# Patient Record
Sex: Female | Born: 1945
Health system: Southern US, Community
[De-identification: ages and names within clinical notes are randomized; demographics above are authoritative.]

## PROBLEM LIST (undated history)

## (undated) DIAGNOSIS — E079 Disorder of thyroid, unspecified: Secondary | ICD-10-CM

## (undated) DIAGNOSIS — T7840XA Allergy, unspecified, initial encounter: Secondary | ICD-10-CM

## (undated) HISTORY — PX: ABDOMINAL HYSTERECTOMY: SHX81

## (undated) HISTORY — DX: Allergy, unspecified, initial encounter: T78.40XA

## (undated) HISTORY — DX: Disorder of thyroid, unspecified: E07.9

---

## 2010-07-30 ENCOUNTER — Encounter: Payer: Self-pay | Admitting: Emergency Medicine

## 2011-09-26 ENCOUNTER — Telehealth: Payer: Self-pay

## 2011-09-26 ENCOUNTER — Other Ambulatory Visit: Payer: Self-pay | Admitting: Family Medicine

## 2011-09-26 MED ORDER — ROSUVASTATIN CALCIUM 10 MG PO TABS: 10.0000 mg | ORAL_TABLET | Freq: Every day | ORAL | Status: DC

## 2011-09-26 MED ORDER — LEVOTHYROXINE SODIUM 75 MCG PO TABS: 75.0000 ug | ORAL_TABLET | Freq: Every day | ORAL | Status: DC

## 2011-09-26 NOTE — Telephone Encounter (Signed)
.  [facility] PT STATES SHE IS IN NEED OF HER CRESTOR AND THYROID MEDICINE. PLEASE CALL [phone]   [facility] ON [address]

## 2011-09-27 NOTE — Telephone Encounter (Signed)
Meds e-rx'd yesterday, pt notified and advised to RTC for more.

## 2011-11-09 ENCOUNTER — Ambulatory Visit (INDEPENDENT_AMBULATORY_CARE_PROVIDER_SITE_OTHER): Payer: Medicare Other | Admitting: Internal Medicine

## 2011-11-09 VITALS — BP 151/87 | HR 62 | Temp 98.2°F | Resp 18 | Ht 64.5 in | Wt 193.0 lb

## 2011-11-09 DIAGNOSIS — E789 Disorder of lipoprotein metabolism, unspecified: Secondary | ICD-10-CM | POA: Insufficient documentation

## 2011-11-09 DIAGNOSIS — E039 Hypothyroidism, unspecified: Secondary | ICD-10-CM | POA: Insufficient documentation

## 2011-11-09 DIAGNOSIS — Z7189 Other specified counseling: Secondary | ICD-10-CM

## 2011-11-09 LAB — COMPREHENSIVE METABOLIC PANEL
ALT: 15 U/L (ref 0–35)
Alkaline Phosphatase: 45 U/L (ref 39–117)
Creat: 0.66 mg/dL (ref 0.50–1.10)
Glucose, Bld: 91 mg/dL (ref 70–99)
Sodium: 141 mEq/L (ref 135–145)
Total Bilirubin: 0.5 mg/dL (ref 0.3–1.2)
Total Protein: 7.1 g/dL (ref 6.0–8.3)

## 2011-11-09 LAB — POCT CBC
HCT, POC: 43.7 % (ref 37.7–47.9)
Hemoglobin: 14.2 g/dL (ref 12.2–16.2)
Lymph, poc: 2.2 (ref 0.6–3.4)
MCH, POC: 31.3 pg — AB (ref 27–31.2)
MCV: 96.5 fL (ref 80–97)
MPV: 11.2 fL (ref 0–99.8)
POC Granulocyte: 4.7 (ref 2–6.9)
Platelet Count, POC: 245 10*3/uL (ref 142–424)

## 2011-11-09 LAB — POCT URINALYSIS DIPSTICK
Bilirubin, UA: NEGATIVE
Blood, UA: NEGATIVE
Protein, UA: NEGATIVE
Urobilinogen, UA: 0.2

## 2011-11-09 LAB — LIPID PANEL
Cholesterol: 283 mg/dL — ABNORMAL HIGH (ref 0–200)
LDL Cholesterol: 197 mg/dL — ABNORMAL HIGH (ref 0–99)
VLDL: 21 mg/dL (ref 0–40)

## 2011-11-09 LAB — TSH: TSH: 3.305 u[IU]/mL (ref 0.350–4.500)

## 2011-11-09 MED ORDER — LEVOTHYROXINE SODIUM 75 MCG PO TABS: 75.0000 ug | ORAL_TABLET | Freq: Every day | ORAL | Status: DC

## 2011-11-09 MED ORDER — ROSUVASTATIN CALCIUM 10 MG PO TABS: 10.0000 mg | ORAL_TABLET | Freq: Every day | ORAL | Status: DC

## 2011-11-09 NOTE — Progress Notes (Signed)
  Subjective:    Patient ID: Stephanie Carter, female    DOB: 08/27/1945, 66 y.o.   MRN: [id number]  HPI Hypothyroid out of meds feels ok Hyperlipidemia out of crestor and feels fine No other complaints   Review of Systems     Objective:   Physical Exam Normal VS Lungs clear Heart RR       Assessment & Plan:  Schedule cpe soon RF meds 6 months

## 2011-11-09 NOTE — Patient Instructions (Signed)
Hypercholesterolemia High Blood Cholesterol Cholesterol is a white, waxy, fat-like protein needed by your body in small amounts. The liver makes all the cholesterol you need. It is carried from the liver by the blood through the blood vessels. Deposits (plaque) may build up on blood vessel walls. This makes the arteries narrower and stiffer. Plaque increases the risk for heart attack and stroke. You cannot feel your cholesterol level even if it is very high. The only way to know is by a blood test to check your lipid (fats) levels. Once you know your cholesterol levels, you should keep a record of the test results. Work with your caregiver to to keep your levels in the desired range. WHAT THE RESULTS MEAN:  Total cholesterol is a rough measure of all the cholesterol in your blood.   LDL is the so-called bad cholesterol. This is the type that deposits cholesterol in the walls of the arteries. You want this level to be low.   HDL is the good cholesterol because it cleans the arteries and carries the LDL away. You want this level to be high.   Triglycerides are fat that the body can either burn for energy or store. High levels are closely linked to heart disease.  DESIRED LEVELS:  Total cholesterol below 200.   LDL below 100 for people at risk, below 70 for very high risk.   HDL above 50 is good, above 60 is best.   Triglycerides below 150.  HOW TO LOWER YOUR CHOLESTEROL:  Diet.   Choose fish or white meat chicken and [place], roasted or baked. Limit fatty cuts of red meat, fried foods, and processed meats, such as sausage and lunch meat.   Eat lots of fresh fruits and vegetables. Choose whole grains, beans, pasta, potatoes and cereals.   Use only small amounts of olive, corn or canola oils. Avoid butter, mayonnaise, shortening or palm kernel oils. Avoid foods with trans-fats.   Use skim/nonfat milk and low-fat/nonfat yogurt and cheeses. Avoid whole milk, cream, ice cream, egg yolks and  cheeses. Healthy desserts include angel food cake, gingersnaps, animal crackers, hard candy, popsicles, and low-fat/nonfat frozen yogurt. Avoid pastries, cakes, pies and cookies.   Exercise.   A regular program helps decrease LDL and raises HDL.   Helps with weight control.   Do things that increase your activity level like gardening, walking, or taking the stairs.   Medication.   May be prescribed by your caregiver to help lowering cholesterol and the risk for heart disease.   You may need medicine even if your levels are normal if you have several risk factors.  HOME CARE INSTRUCTIONS   Follow your diet and exercise programs as suggested by your caregiver.   Take medications as directed.   Have blood work done when your caregiver feels it is necessary.  MAKE SURE YOU:   Understand these instructions.   Will watch your condition.   Will get help right away if you are not doing well or get worse.  Document Released: 06/25/2005 Document Revised: 06/14/2011 Document Reviewed: 12/11/2006 [facility] Patient Information 2012 [place], [place].Hypothyroidism The thyroid is a large gland located in the lower front of your neck. The thyroid gland helps control metabolism. Metabolism is how your body handles food. It controls metabolism with the hormone thyroxine. When this gland is underactive (hypothyroid), it produces too little hormone.  CAUSES These include:   Absence or destruction of thyroid tissue.   Goiter due to iodine deficiency.   Goiter due  to medications.   Congenital defects (since birth).   Problems with the pituitary. This causes a lack of TSH (thyroid stimulating hormone). This hormone tells the thyroid to turn out more hormone.  SYMPTOMS  Lethargy (feeling as though you have no energy)   Cold intolerance   Weight gain (in spite of normal food intake)   Dry skin   Coarse hair   Menstrual irregularity (if severe, may lead to infertility)   Slowing of  thought processes  Cardiac problems are also caused by insufficient amounts of thyroid hormone. Hypothyroidism in the newborn is cretinism, and is an extreme form. It is important that this form be treated adequately and immediately or it will lead rapidly to retarded physical and mental development. DIAGNOSIS  To prove hypothyroidism, your caregiver may do blood tests and ultrasound tests. Sometimes the signs are hidden. It may be necessary for your caregiver to watch this illness with blood tests either before or after diagnosis and treatment. TREATMENT  Low levels of thyroid hormone are increased by using synthetic thyroid hormone. This is a safe, effective treatment. It usually takes about four weeks to gain the full effects of the medication. After you have the full effect of the medication, it will generally take another four weeks for problems to leave. Your caregiver may start you on low doses. If you have had heart problems the dose may be gradually increased. It is generally not an emergency to get rapidly to normal. HOME CARE INSTRUCTIONS   Take your medications as your caregiver suggests. Let your caregiver know of any medications you are taking or start taking. Your caregiver will help you with dosage schedules.   As your condition improves, your dosage needs may increase. It will be necessary to have continuing blood tests as suggested by your caregiver.   Report all suspected medication side effects to your caregiver.  SEEK MEDICAL CARE IF: Seek medical care if you develop:  Sweating.   Tremulousness (tremors).   Anxiety.   Rapid weight loss.   Heat intolerance.   Emotional swings.   Diarrhea.   Weakness.  SEEK IMMEDIATE MEDICAL CARE IF:  You develop chest pain, an irregular heart beat (palpitations), or a rapid heart beat. MAKE SURE YOU:   Understand these instructions.   Will watch your condition.   Will get help right away if you are not doing well or get  worse.  Document Released: 06/25/2005 Document Revised: 06/14/2011 Document Reviewed: 02/13/2008 [facility] Patient Information 2012 [place], [place].

## 2012-06-16 ENCOUNTER — Encounter: Payer: Self-pay | Admitting: *Deleted

## 2012-06-20 ENCOUNTER — Other Ambulatory Visit: Payer: Self-pay | Admitting: Internal Medicine

## 2012-07-10 ENCOUNTER — Telehealth: Payer: Self-pay

## 2012-07-10 NOTE — Telephone Encounter (Signed)
Pt has 10 days of cholesterol and thyroid rx left. Has appt to see dr Milus Glazier on 08-08-11. Asks for refill until then.  [facility].  bf

## 2012-07-11 MED ORDER — LEVOTHYROXINE SODIUM 75 MCG PO TABS: 75.0000 ug | ORAL_TABLET | Freq: Every day | ORAL | Status: DC

## 2012-07-11 MED ORDER — ROSUVASTATIN CALCIUM 10 MG PO TABS: 10.0000 mg | ORAL_TABLET | Freq: Every day | ORAL | Status: DC

## 2012-07-11 NOTE — Telephone Encounter (Signed)
Crestor and Synthroid refilled for 30 days

## 2012-07-11 NOTE — Telephone Encounter (Signed)
Notified pt that Rxs have been sent to pharmacy.

## 2012-08-07 ENCOUNTER — Encounter: Payer: Self-pay | Admitting: Family Medicine

## 2012-08-07 ENCOUNTER — Ambulatory Visit (INDEPENDENT_AMBULATORY_CARE_PROVIDER_SITE_OTHER): Payer: Medicare Other | Admitting: Family Medicine

## 2012-08-07 VITALS — BP 176/89 | HR 66 | Temp 97.4°F | Resp 18 | Ht 64.5 in | Wt 204.0 lb

## 2012-08-07 DIAGNOSIS — E785 Hyperlipidemia, unspecified: Secondary | ICD-10-CM

## 2012-08-07 DIAGNOSIS — J310 Chronic rhinitis: Secondary | ICD-10-CM

## 2012-08-07 DIAGNOSIS — Z0000 Encounter for general adult medical examination without abnormal findings: Secondary | ICD-10-CM

## 2012-08-07 DIAGNOSIS — E039 Hypothyroidism, unspecified: Secondary | ICD-10-CM

## 2012-08-07 LAB — COMPREHENSIVE METABOLIC PANEL
ALT: 16 U/L (ref 0–35)
AST: 18 U/L (ref 0–37)
Albumin: 4.5 g/dL (ref 3.5–5.2)
Alkaline Phosphatase: 46 U/L (ref 39–117)
BUN: 19 mg/dL (ref 6–23)
CO2: 29 mEq/L (ref 19–32)
Calcium: 9.5 mg/dL (ref 8.4–10.5)
Chloride: 107 mEq/L (ref 96–112)
Creat: 0.67 mg/dL (ref 0.50–1.10)
Glucose, Bld: 104 mg/dL — ABNORMAL HIGH (ref 70–99)
Potassium: 4.3 mEq/L (ref 3.5–5.3)
Sodium: 141 mEq/L (ref 135–145)
Total Bilirubin: 0.6 mg/dL (ref 0.3–1.2)
Total Protein: 6.8 g/dL (ref 6.0–8.3)

## 2012-08-07 LAB — POCT URINALYSIS DIPSTICK
Bilirubin, UA: NEGATIVE
Glucose, UA: NEGATIVE
Ketones, UA: NEGATIVE
Nitrite, UA: NEGATIVE
Protein, UA: NEGATIVE
Spec Grav, UA: 1.025
Urobilinogen, UA: 0.2
pH, UA: 5.5

## 2012-08-07 LAB — LIPID PANEL
Cholesterol: 187 mg/dL (ref 0–200)
HDL: 72 mg/dL (ref >39)
LDL Cholesterol: 100 mg/dL — ABNORMAL HIGH (ref 0–99)
Total CHOL/HDL Ratio: 2.6 Ratio
Triglycerides: 76 mg/dL (ref <150)
VLDL: 15 mg/dL (ref 0–40)

## 2012-08-07 LAB — TSH: TSH: 1.067 u[IU]/mL (ref 0.350–4.500)

## 2012-08-07 MED ORDER — IPRATROPIUM BROMIDE 0.06 % NA SOLN: 2.0000 | Freq: Four times a day (QID) | NASAL | Status: DC

## 2012-08-07 MED ORDER — ROSUVASTATIN CALCIUM 10 MG PO TABS: 10.0000 mg | ORAL_TABLET | Freq: Every day | ORAL | Status: DC

## 2012-08-07 MED ORDER — LEVOTHYROXINE SODIUM 75 MCG PO TABS: 75.0000 ug | ORAL_TABLET | Freq: Every day | ORAL | Status: DC

## 2012-08-07 NOTE — Progress Notes (Signed)
Patient ID: Stephanie Carter MRN: [id number], DOB: 1945/08/06, 67 y.o. Date of Encounter: 08/07/2012, 4:58 PM  Primary Physician: Elvina Sidle, MD  Chief Complaint: Physical (CPE)  HPI: 67 y.o. y/o female with history of noted below here for CPE.  Doing well.  Originally from [place] No issues/complaints.  Works at [employer] and [employer].  Quit cigs for 6 weeks but then picked them back up again; chronically wheezes.  Wants to quit.  Chantix made her sick.  Will try gum   MMG: 2011; patient will schedule Review of Systems: Consitutional: No fever, chills, fatigue, night sweats, lymphadenopathy, or weight changes. Eyes: No visual changes, eye redness, or discharge. ENT/Mouth: Ears: No otalgia, tinnitus, hearing loss, discharge. Nose: No congestion, rhinorrhea, sinus pain, or epistaxis. Throat: No sore throat, post nasal drip, or teeth pain. Cardiovascular: No CP, palpitations, diaphoresis, DOE, edema, orthopnea, PND. Respiratory: No cough, hemoptysis, SOB, or wheezing. Gastrointestinal: No anorexia, dysphagia, reflux, pain, nausea, vomiting, hematemesis, diarrhea, constipation, BRBPR, or melena. Breast: No discharge, pain, swelling, or mass. Genitourinary: No dysuria, frequency, urgency, hematuria, incontinence, nocturia, amenorrhea, vaginal discharge, pruritis, burning, abnormal bleeding, or pain. Musculoskeletal: No decreased ROM, myalgias, stiffness, joint swelling, or weakness. Skin: No rash, erythema, lesion changes, pain, warmth, jaundice, or pruritis. Neurological: No headache, dizziness, syncope, seizures, tremors, memory loss, coordination problems, or paresthesias. Psychological: No anxiety, depression, hallucinations, SI/HI. Endocrine: No fatigue, polydipsia, polyphagia, polyuria, or known diabetes. All other systems were reviewed and are otherwise negative.  Past Medical History  Diagnosis Date  . Allergy      Past Surgical History  Procedure Date  . Abdominal  hysterectomy     Home Meds:  Prior to Admission medications   Medication Sig Start Date End Date Taking? Authorizing Provider  levothyroxine (SYNTHROID, LEVOTHROID) 75 MCG tablet Take 1 tablet (75 mcg total) by mouth daily. 07/11/12  Yes Eleanore Delia Chimes, PA-C  rosuvastatin (CRESTOR) 10 MG tablet Take 1 tablet (10 mg total) by mouth daily. 07/11/12  Yes Godfrey Pick, PA-C    Allergies: No Known Allergies  History   Social History  . Marital Status: Single    Spouse Name: N/A    Number of Children: N/A  . Years of Education: N/A   Occupational History  . Not on file.   Social History Main Topics  . Smoking status: Current Every Day Smoker  . Smokeless tobacco: Not on file  . Alcohol Use: Yes  . Drug Use: No  . Sexually Active: No   Other Topics Concern  . Not on file   Social History Narrative  . No narrative on file    Family History  Problem Relation Age of Onset  . Stroke Father     Physical Exam:  128/74 Blood pressure 176/89, pulse 66, temperature 97.4 F (36.3 C), resp. rate 18, height 5' 4.5" (1.638 m), weight 204 lb (92.534 kg)., Body mass index is 34.48 kg/(m^2). General: Well developed, well nourished, in no acute distress. HEENT: Normocephalic, atraumatic. Conjunctiva pink, sclera non-icteric. Pupils 2 mm constricting to 1 mm, round, regular, and equally reactive to light and accomodation. EOMI. Internal auditory canal clear. TMs with good cone of light and without pathology. Nasal mucosa pink. Nares are without discharge. No sinus tenderness. Oral mucosa pink. Dentition poor. Pharynx without exudate.   Neck: Supple. Trachea midline. No thyromegaly. Full ROM. No lymphadenopathy. Lungs: Clear to auscultation bilaterally without wheezes, rales, or rhonchi. Breathing is of normal effort and unlabored. Cardiovascular: RRR with S1 S2. No murmurs,  rubs, or gallops appreciated. Distal pulses 2+ symmetrically. No carotid or abdominal bruits. Abdomen: Soft,  non-tender, non-distended with normoactive bowel sounds. No hepatosplenomegaly or masses. No rebound/guarding. No CVA tenderness. Without hernias.  Musculoskeletal: Full range of motion and 5/5 strength throughout. Without swelling, atrophy, tenderness, crepitus, or warmth. Extremities without clubbing, cyanosis, or edema. Calves supple. Skin: Warm and moist without erythema, ecchymosis, wounds, or rash. Neuro: A+Ox3. CN II-XII grossly intact. Moves all extremities spontaneously. Full sensation throughout. Normal gait. DTR 2+ throughout upper and lower extremities. Finger to nose intact. Psych:  Responds to questions appropriately with a normal affect.   Studies: CBC, CMET, Lipid UA:  Results for orders placed in visit on 08/07/12  POCT URINALYSIS DIPSTICK      Component Value Range   Color, UA yellow     Clarity, UA clear     Glucose, UA neg     Bilirubin, UA neg     Ketones, UA neg     Spec Grav, UA 1.025     Blood, UA trace-lysed     pH, UA 5.5     Protein, UA neg     Urobilinogen, UA 0.2     Nitrite, UA neg     Leukocytes, UA small (1+)       Assessment/Plan:  67 y.o. y/o female here for CPE 1. PE (physical exam), annual  POCT urinalysis dipstick, Comprehensive metabolic panel, Lipid panel, TSH, Ambulatory referral to Gastroenterology     -  Signed, Elvina Sidle, MD 08/07/2012 4:58 PM

## 2013-09-10 ENCOUNTER — Telehealth: Payer: Self-pay

## 2013-09-10 DIAGNOSIS — E785 Hyperlipidemia, unspecified: Secondary | ICD-10-CM

## 2013-09-10 DIAGNOSIS — E039 Hypothyroidism, unspecified: Secondary | ICD-10-CM

## 2013-09-10 MED ORDER — ROSUVASTATIN CALCIUM 10 MG PO TABS: 10.0000 mg | ORAL_TABLET | Freq: Every day | ORAL | Status: DC

## 2013-09-10 MED ORDER — LEVOTHYROXINE SODIUM 75 MCG PO TABS: 75.0000 ug | ORAL_TABLET | Freq: Every day | ORAL | Status: DC

## 2013-09-10 NOTE — Telephone Encounter (Signed)
Pt scheduled appt with Dr L for her CPE. Next available is 11/05/13. Pt requests refill on crestor and levathoroxin until then. She will be out on Saturday.  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  Best: [phone]    [facility]

## 2013-09-10 NOTE — Telephone Encounter (Signed)
Dr L, can we RF until her CPE appt?

## 2013-09-11 ENCOUNTER — Other Ambulatory Visit: Payer: Self-pay | Admitting: Family Medicine

## 2013-09-11 DIAGNOSIS — E785 Hyperlipidemia, unspecified: Secondary | ICD-10-CM

## 2013-09-11 MED ORDER — ATORVASTATIN CALCIUM 20 MG PO TABS: 20.0000 mg | ORAL_TABLET | Freq: Every day | ORAL | Status: DC

## 2013-09-11 NOTE — Telephone Encounter (Signed)
Patient would like if okay wiht provider to change her crestor [facility] a [facility]   [phone]

## 2013-11-05 ENCOUNTER — Ambulatory Visit (INDEPENDENT_AMBULATORY_CARE_PROVIDER_SITE_OTHER): Payer: Medicare Other | Admitting: Family Medicine

## 2013-11-05 ENCOUNTER — Encounter: Payer: Self-pay | Admitting: Family Medicine

## 2013-11-05 VITALS — BP 132/76 | HR 56 | Temp 97.6°F | Resp 16 | Ht 64.5 in | Wt 198.0 lb

## 2013-11-05 DIAGNOSIS — Z1239 Encounter for other screening for malignant neoplasm of breast: Secondary | ICD-10-CM

## 2013-11-05 DIAGNOSIS — Z0000 Encounter for general adult medical examination without abnormal findings: Secondary | ICD-10-CM

## 2013-11-05 DIAGNOSIS — L408 Other psoriasis: Secondary | ICD-10-CM

## 2013-11-05 DIAGNOSIS — F17200 Nicotine dependence, unspecified, uncomplicated: Secondary | ICD-10-CM | POA: Insufficient documentation

## 2013-11-05 DIAGNOSIS — Z87891 Personal history of nicotine dependence: Secondary | ICD-10-CM | POA: Insufficient documentation

## 2013-11-05 DIAGNOSIS — E785 Hyperlipidemia, unspecified: Secondary | ICD-10-CM

## 2013-11-05 DIAGNOSIS — L409 Psoriasis, unspecified: Secondary | ICD-10-CM | POA: Insufficient documentation

## 2013-11-05 DIAGNOSIS — E039 Hypothyroidism, unspecified: Secondary | ICD-10-CM

## 2013-11-05 LAB — COMPLETE METABOLIC PANEL WITH GFR
ALT: 17 U/L (ref 0–35)
AST: 17 U/L (ref 0–37)
Albumin: 4.4 g/dL (ref 3.5–5.2)
Alkaline Phosphatase: 42 U/L (ref 39–117)
BUN: 16 mg/dL (ref 6–23)
CO2: 29 mEq/L (ref 19–32)
Calcium: 9.5 mg/dL (ref 8.4–10.5)
Chloride: 104 mEq/L (ref 96–112)
Creat: 0.71 mg/dL (ref 0.50–1.10)
GFR, Est African American: >89 mL/min
GFR, Est Non African American: 87 mL/min
Glucose, Bld: 101 mg/dL — ABNORMAL HIGH (ref 70–99)
Potassium: 4.1 mEq/L (ref 3.5–5.3)
Sodium: 142 mEq/L (ref 135–145)
Total Bilirubin: 0.8 mg/dL (ref 0.2–1.2)
Total Protein: 6.8 g/dL (ref 6.0–8.3)

## 2013-11-05 LAB — LIPID PANEL
Cholesterol: 161 mg/dL (ref 0–200)
HDL: 71 mg/dL (ref >39)
LDL Cholesterol: 66 mg/dL (ref 0–99)
Total CHOL/HDL Ratio: 2.3 Ratio
Triglycerides: 118 mg/dL (ref <150)
VLDL: 24 mg/dL (ref 0–40)

## 2013-11-05 LAB — POCT URINALYSIS DIPSTICK
Bilirubin, UA: NEGATIVE
Glucose, UA: NEGATIVE
Ketones, UA: NEGATIVE
Nitrite, UA: NEGATIVE
Protein, UA: NEGATIVE
Spec Grav, UA: 1.025
Urobilinogen, UA: 0.2
pH, UA: 5.5

## 2013-11-05 LAB — CBC
HCT: 40.6 % (ref 36.0–46.0)
Hemoglobin: 13.9 g/dL (ref 12.0–15.0)
MCH: 31.9 pg (ref 26.0–34.0)
MCHC: 34.2 g/dL (ref 30.0–36.0)
MCV: 93.1 fL (ref 78.0–100.0)
Platelets: 215 10*3/uL (ref 150–400)
RBC: 4.36 MIL/uL (ref 3.87–5.11)
RDW: 13.5 % (ref 11.5–15.5)
WBC: 5.1 10*3/uL (ref 4.0–10.5)

## 2013-11-05 LAB — TSH: TSH: 1.857 u[IU]/mL (ref 0.350–4.500)

## 2013-11-05 MED ORDER — DESONIDE 0.05 % EX OINT: 1.0000 "application " | TOPICAL_OINTMENT | Freq: Two times a day (BID) | CUTANEOUS | Status: DC

## 2013-11-05 MED ORDER — LEVOTHYROXINE SODIUM 75 MCG PO TABS: 75.0000 ug | ORAL_TABLET | Freq: Every day | ORAL | Status: DC

## 2013-11-05 NOTE — Patient Instructions (Signed)

## 2013-11-05 NOTE — Progress Notes (Addendum)
Subjective:    Patient ID: Stephanie Carter, female    DOB: 02/09/1946, 68 y.o.   MRN: [id number] This chart was scribed for Robyn Haber, MD by Anastasia Pall, ED Scribe. This patient was seen in room 27 and the patient's care was started at 9:07 AM.  Chief Complaint  Patient presents with   Annual Exam   Medication Refill    desonide, lipitor   HPI Stephanie Carter is a 68 y.o. female with history below who presents for an annual exam and medication refill.   She reports bilateral LE pain and stiffness, worse in her right knee and shin than her left. She states standing for long periods of time exacerbates her LE pain, especially after working all day. She used to play a lot of racquetball, but has quit due to the starting and stopping nature of the sport.   She reports quitting smoking for 6 weeks while in [place], but resumed smoking [place].   She reports intermittent nasal congestion with environmental allergies around this time of year. She usually does not have issues while in [place].   She reports having colonoscopy last year by Dr. Benson Norway. She denies abdominal pain, but reports occasional gas. She has h/o hysterectomy.   She states she is ready to quit working, wants to travel [place]. She states she traveled to [place] not too long ago and states it was marvelous!  Patient Active Problem List   Diagnosis Date Noted   Lipid disorder 11/09/2011   Hypothyroid 11/09/2011   Past Medical History  Diagnosis Date   Allergy    Thyroid disease    Past Surgical History  Procedure Laterality Date   Abdominal hysterectomy     No Known Allergies Prior to Admission medications   Medication Sig Start Date End Date Taking? Authorizing Provider  atorvastatin (LIPITOR) 20 MG tablet Take 1 tablet (20 mg total) by mouth daily. 09/11/13  Yes Robyn Haber, MD  desonide (DESOWEN) 0.05 % ointment Apply 1 application topically 2 (two) times daily.   Yes  Historical Provider, MD  ipratropium (ATROVENT) 0.06 % nasal spray Place 2 sprays into the nose 4 (four) times daily. 08/07/12  Yes Robyn Haber, MD  levothyroxine (SYNTHROID, LEVOTHROID) 75 MCG tablet Take 1 tablet (75 mcg total) by mouth daily. 09/10/13  Yes Robyn Haber, MD  Multiple Vitamin (MULTIVITAMIN) tablet Take 1 tablet by mouth daily.   Yes Historical Provider, MD    Review of Systems  Constitutional: Negative for fever, chills, diaphoresis, appetite change and fatigue.  HENT: Positive for congestion (secondary to enivornmental allergies). Negative for ear pain, hearing loss, mouth sores, sore throat and trouble swallowing.   Eyes: Negative for visual disturbance.  Respiratory: Negative for cough, chest tightness, shortness of breath and wheezing.   Cardiovascular: Negative for chest pain.  Gastrointestinal: Positive for abdominal distention (occasional gas). Negative for nausea, vomiting, abdominal pain, diarrhea and constipation.  Endocrine: Negative for polydipsia, polyphagia and polyuria.  Genitourinary: Negative for dysuria, frequency and hematuria.  Musculoskeletal: Positive for arthralgias (bilateral knees, worse right than left) and myalgias (bilateral LE, worse in right shin). Negative for gait problem.  Skin: Negative for color change, rash and wound.  Allergic/Immunologic: Positive for environmental allergies.  Neurological: Negative for dizziness, syncope, light-headedness and headaches.  Hematological: Does not bruise/bleed easily.  Psychiatric/Behavioral: Negative for behavioral problems and confusion.      Objective:   Physical Exam  BP 132/76   Pulse 56  Temp(Src) 97.6 F (36.4 C)   Resp 16   Ht 5' 4.5" (1.638 m)   Wt 198 lb (89.812 kg)   BMI 33.47 kg/m2   SpO2 98% Nursing note and vitals reviewed. Constitutional: She is oriented to person, place, and time. She appears well-developed and well-nourished. No distress.  HENT:  Head: Normocephalic and  atraumatic.  Right Ear: Hearing, external ear and ear canal normal.  Left Ear: Hearing, external ear and ear canal normal.  Nose: Nose normal.  Mouth/Throat: Uvula is midline, oropharynx is clear and moist and mucous membranes are normal. No oropharyngeal exudate or posterior oropharyngeal erythema.  Scaring in bilateral TM, worse left TM.  Eyes: Conjunctivae and EOM are normal. Pupils are equal, round, and reactive to light.  Neck: Normal range of motion. Neck supple. No thyromegaly present.  Cardiovascular: Normal rate, regular rhythm, normal heart sounds and intact distal pulses.   No murmur heard. Pulmonary/Chest: Effort normal and breath sounds normal. No respiratory distress. She has no wheezes. She has no rales. She exhibits no tenderness. Breast exam normal. Abdominal: Soft. Bowel sounds are normal. She exhibits no distension and no mass. There is no hepatosplenomegaly. There is no tenderness. There is no rebound and no guarding.  Musculoskeletal: Normal range of motion. She exhibits no edema and no tenderness.  Lymphadenopathy:    She has no cervical adenopathy.  Neurological: She is alert and oriented to person, place, and time. She has normal strength and normal reflexes. No cranial nerve deficit or sensory deficit. She exhibits normal muscle tone. She displays a negative Romberg sign. Coordination normal.  Skin: Skin is warm and dry. She has mild erythema behind left ear and scattered over scalp Psychiatric: She has a normal mood and affect. Her behavior is normal.      Assessment & Plan:   Screening for breast cancer - Plan: MM Digital Screening  Hypothyroidism - Plan: levothyroxine (SYNTHROID, LEVOTHROID) 75 MCG tablet, TSH  Routine general medical examination at a health care facility - Plan: POCT urinalysis dipstick, TSH, CBC, COMPLETE METABOLIC PANEL WITH GFR  Other and unspecified hyperlipidemia - Plan: Lipid panel  Signed, Robyn Haber, MD

## 2013-11-05 NOTE — Progress Notes (Signed)
   Subjective:    Patient ID: Stephanie Carter, female    DOB: 12/16/1945, 68 y.o.   MRN: [id number]  HPI    Review of Systems  HENT: Positive for postnasal drip and sinus pressure.   Musculoskeletal: Positive for arthralgias and myalgias.  Skin: Positive for rash.  Allergic/Immunologic: Positive for environmental allergies.       Objective:   Physical Exam        Assessment & Plan:

## 2013-12-09 LAB — HM MAMMOGRAPHY

## 2013-12-25 ENCOUNTER — Encounter: Payer: Self-pay | Admitting: Family Medicine

## 2014-05-03 ENCOUNTER — Ambulatory Visit (INDEPENDENT_AMBULATORY_CARE_PROVIDER_SITE_OTHER): Payer: Medicare Other | Admitting: Family Medicine

## 2014-05-03 ENCOUNTER — Encounter: Payer: Self-pay | Admitting: Family Medicine

## 2014-05-03 VITALS — BP 152/94 | HR 78 | Temp 98.3°F | Resp 17 | Ht 64.0 in | Wt 196.0 lb

## 2014-05-03 DIAGNOSIS — R059 Cough, unspecified: Secondary | ICD-10-CM

## 2014-05-03 DIAGNOSIS — R05 Cough: Secondary | ICD-10-CM

## 2014-05-03 DIAGNOSIS — Z1159 Encounter for screening for other viral diseases: Secondary | ICD-10-CM

## 2014-05-03 DIAGNOSIS — L409 Psoriasis, unspecified: Secondary | ICD-10-CM

## 2014-05-03 DIAGNOSIS — J069 Acute upper respiratory infection, unspecified: Secondary | ICD-10-CM

## 2014-05-03 LAB — HEPATITIS C ANTIBODY: HCV AB: NEGATIVE

## 2014-05-03 MED ORDER — DOXYCYCLINE HYCLATE 100 MG PO CAPS: 100.0000 mg | ORAL_CAPSULE | Freq: Two times a day (BID) | ORAL | Status: DC

## 2014-05-03 MED ORDER — TRIAMCINOLONE ACETONIDE 0.1 % EX CREA: 1.0000 "application " | TOPICAL_CREAM | Freq: Two times a day (BID) | CUTANEOUS | Status: DC

## 2014-05-03 MED ORDER — ALBUTEROL SULFATE HFA 108 (90 BASE) MCG/ACT IN AERS: 2.0000 | INHALATION_SPRAY | Freq: Four times a day (QID) | RESPIRATORY_TRACT | Status: DC | PRN

## 2014-05-03 MED ORDER — HYDROCODONE-HOMATROPINE 5-1.5 MG/5ML PO SYRP: 5.0000 mL | ORAL_SOLUTION | Freq: Three times a day (TID) | ORAL | Status: DC | PRN

## 2014-05-03 NOTE — Patient Instructions (Signed)
You most likely have a viral infection.  Use the hycodan syrup for cough- however remember this will make you sleepy so do not use when you need to drive.  It is ok to continue OTC medications as needed.  Also you can use the albuterol inhaler as needed for wheezing and chest tightness.   Try the triamcinolone instead for your psoriasis it should be less expensive.    If you are not feeling better in the next few days fill and use the doxycycline antibiotic.  Take this with food and a good glass of water

## 2014-05-03 NOTE — Progress Notes (Signed)
[facility] [address] [place] [address] [phone]  Date:  05/03/2014   Name:  Stephanie Carter   DOB:  [birth year]   MRN:  [id number]  PCP:  Robyn Haber, MD    Chief Complaint: Cough, URI and Nasal Congestion   History of Present Illness:  Stephanie Carter is a 68 y.o. very pleasant female patient who presents with the following:  Here today with illness. Her sx started with a "drippy nose and nose stopped up."  She thought the issue was with her sinuses as she had a mild frontal HA.  However sx then turned into chest congestion and productive cough.  Sx started about 5 days ago.   She is coughing more at night.  She has felt hot but does not think she has had a fever.  She has not noted chills or aches.  No GI symptoms.  She has tried some sort of mucinex OTC for her sx   She likes to use some desonide for psoriasis. However it was quite expensive when she last tried to fill it; would be interested in an alternative medication She would like to have hep c screenig- she has seen a billboard advertising this recommendation  Patient Active Problem List   Diagnosis Date Noted  . Psoriasis 11/05/2013  . Smoking 11/05/2013  . Lipid disorder 11/09/2011  . Hypothyroid 11/09/2011    Past Medical History  Diagnosis Date  . Allergy   . Thyroid disease     Past Surgical History  Procedure Laterality Date  . Abdominal hysterectomy      History  Substance Use Topics  . Smoking status: Current Every Day Smoker -- 1.00 packs/day for 49 years    Types: Cigarettes  . Smokeless tobacco: Not on file  . Alcohol Use: Yes    Family History  Problem Relation Age of Onset  . Stroke Father     No Known Allergies  Medication list has been reviewed and updated.  Current Outpatient Prescriptions on File Prior to Visit  Medication Sig Dispense Refill  . atorvastatin (LIPITOR) 20 MG tablet Take 1 tablet (20 mg total) by mouth daily.  90 tablet  3  .  desonide (DESOWEN) 0.05 % ointment Apply 1 application topically 2 (two) times daily.  60 g  11  . ipratropium (ATROVENT) 0.06 % nasal spray Place 2 sprays into the nose 4 (four) times daily.  15 mL  12  . levothyroxine (SYNTHROID, LEVOTHROID) 75 MCG tablet Take 1 tablet (75 mcg total) by mouth daily.  90 tablet  3  . Multiple Vitamin (MULTIVITAMIN) tablet Take 1 tablet by mouth daily.       No current facility-administered medications on file prior to visit.    Review of Systems:  As per HPI- otherwise negative.   Physical Examination: Filed Vitals:   05/03/14 0830  BP: 152/94  Pulse: 78  Temp: 98.3 F (36.8 C)  Resp: 17   Filed Vitals:   05/03/14 0830  Height: 5\' 4"  (1.626 m)  Weight: 196 lb (88.905 kg)   Body mass index is 33.63 kg/(m^2). Ideal Body Weight: Weight in (lb) to have BMI = 25: 145.3  GEN: WDWN, NAD, Non-toxic, A & O x 3, looks well except for nasal congestion, overweight HEENT: Atraumatic, Normocephalic. Neck supple. No masses, No LAD. Bilateral TM wnl, oropharynx normal.  PEERL,EOMI.   Ears and Nose: No external deformity. CV: RRR, No M/G/R. No JVD. No thrill. No extra  heart sounds. PULM: CTA B, no wheezes, crackles, rhonchi. No retractions. No resp. distress. No accessory muscle use. EXTR: No c/c/e NEURO Normal gait.  PSYCH: Normally interactive. Conversant. Not depressed or anxious appearing.  Calm demeanor.   BP Readings from Last 3 Encounters:  05/03/14 152/94  11/05/13 132/76  08/07/12 176/89     Assessment and Plan: Viral URI - Plan: albuterol (PROVENTIL HFA;VENTOLIN HFA) 108 (90 BASE) MCG/ACT inhaler  Cough - Plan: HYDROcodone-homatropine (HYCODAN) 5-1.5 MG/5ML syrup, doxycycline (VIBRAMYCIN) 100 MG capsule  Need for hepatitis C screening test - Plan: Hepatitis C antibody  Psoriasis - Plan: triamcinolone cream (KENALOG) 0.1 %  Likely viral URI, doxycycline to hold.   Try triamcinolone instead, should be cheaper See patient instructions  for more details.   Let me know if not getting better  Signed Lamar Blinks, MD

## 2014-06-21 ENCOUNTER — Telehealth: Payer: Self-pay | Admitting: *Deleted

## 2014-06-21 NOTE — Telephone Encounter (Signed)
Called patient left message in voice mail to call back to let the office know if he had flu vaccine.

## 2014-07-12 ENCOUNTER — Ambulatory Visit (INDEPENDENT_AMBULATORY_CARE_PROVIDER_SITE_OTHER): Payer: Medicare Other | Admitting: Internal Medicine

## 2014-07-12 VITALS — BP 124/86 | HR 68 | Temp 97.5°F | Resp 18 | Ht 65.0 in | Wt 198.4 lb

## 2014-07-12 DIAGNOSIS — J301 Allergic rhinitis due to pollen: Secondary | ICD-10-CM

## 2014-07-12 DIAGNOSIS — J0141 Acute recurrent pansinusitis: Secondary | ICD-10-CM

## 2014-07-12 DIAGNOSIS — H65193 Other acute nonsuppurative otitis media, bilateral: Secondary | ICD-10-CM

## 2014-07-12 MED ORDER — AMOXICILLIN 500 MG PO CAPS: 1000.0000 mg | ORAL_CAPSULE | Freq: Two times a day (BID) | ORAL | Status: DC

## 2014-07-12 MED ORDER — FLUTICASONE PROPIONATE 50 MCG/ACT NA SUSP: 2.0000 | Freq: Every day | NASAL | Status: DC

## 2014-07-12 MED ORDER — PREDNISONE 10 MG PO TABS: ORAL_TABLET | ORAL | Status: DC

## 2014-07-12 NOTE — Progress Notes (Signed)
   Subjective:    Patient ID: Stephanie Carter, female    DOB: December 08, 1945, 69 y.o.   MRN: [id number]  HPI 10 days of progressive congestion from allergys, then painful ears and sinuses, next rupture right TM, hx of this in the past. Has cough but no sob,cp. No blood seen. Clear hx of perenial allergys on no dail tx.   Review of Systems     Objective:   Physical Exam  Constitutional: She is oriented to person, place, and time. She appears well-developed and well-nourished. No distress.  HENT:  Head: Normocephalic.  Right Ear: There is drainage and tenderness. Tympanic membrane is injected and perforated. A middle ear effusion is present. Decreased hearing is noted.  Left Ear: There is swelling and tenderness. Tympanic membrane is erythematous and bulging. Tympanic membrane is not perforated. A middle ear effusion is present. Decreased hearing is noted.  Nose: Mucosal edema, rhinorrhea and sinus tenderness present. Right sinus exhibits maxillary sinus tenderness and frontal sinus tenderness. Left sinus exhibits maxillary sinus tenderness and frontal sinus tenderness.  Eyes: Conjunctivae and EOM are normal. Pupils are equal, round, and reactive to light.  Neck: Normal range of motion. Neck supple. No thyromegaly present.  Cardiovascular: Normal rate.   Pulmonary/Chest: Effort normal and breath sounds normal.  Musculoskeletal: Normal range of motion.  Lymphadenopathy:    She has no cervical adenopathy.  Neurological: She is alert and oriented to person, place, and time. She exhibits normal muscle tone. Coordination normal.  Skin: She is not diaphoretic.  Psychiatric: She has a normal mood and affect. Her behavior is normal.          Assessment & Plan:  Sinusitis  Aom with perforation right/Bulging left TM Chronic allergic rhinitis  Prednisone/Amoxil/Fluticasone nasal future prevention

## 2014-07-12 NOTE — Patient Instructions (Signed)
Allergic Rhinitis Allergic rhinitis is when the mucous membranes in the nose respond to allergens. Allergens are particles in the air that cause your body to have an allergic reaction. This causes you to release allergic antibodies. Through a chain of events, these eventually cause you to release histamine into the blood stream. Although meant to protect the body, it is this release of histamine that causes your discomfort, such as frequent sneezing, congestion, and an itchy, runny nose.  CAUSES  Seasonal allergic rhinitis (hay fever) is caused by pollen allergens that may come from grasses, trees, and weeds. Year-round allergic rhinitis (perennial allergic rhinitis) is caused by allergens such as house dust mites, pet dander, and mold spores.  SYMPTOMS   Nasal stuffiness (congestion).  Itchy, runny nose with sneezing and tearing of the eyes. DIAGNOSIS  Your health care provider can help you determine the allergen or allergens that trigger your symptoms. If you and your health care provider are unable to determine the allergen, skin or blood testing may be used. TREATMENT  Allergic rhinitis does not have a cure, but it can be controlled by:  Medicines and allergy shots (immunotherapy).  Avoiding the allergen. Hay fever may often be treated with antihistamines in pill or nasal spray forms. Antihistamines block the effects of histamine. There are over-the-counter medicines that may help with nasal congestion and swelling around the eyes. Check with your health care provider before taking or giving this medicine.  If avoiding the allergen or the medicine prescribed do not work, there are many new medicines your health care provider can prescribe. Stronger medicine may be used if initial measures are ineffective. Desensitizing injections can be used if medicine and avoidance does not work. Desensitization is when a patient is given ongoing shots until the body becomes less sensitive to the allergen.  Make sure you follow up with your health care provider if problems continue. HOME CARE INSTRUCTIONS It is not possible to completely avoid allergens, but you can reduce your symptoms by taking steps to limit your exposure to them. It helps to know exactly what you are allergic to so that you can avoid your specific triggers. SEEK MEDICAL CARE IF:   You have a fever.  You develop a cough that does not stop easily (persistent).  You have shortness of breath.  You start wheezing.  Symptoms interfere with normal daily activities. Document Released: 03/20/2001 Document Revised: 06/30/2013 Document Reviewed: 03/02/2013 [facility] Patient Information 2015 [place], [place]. This information is not intended to replace advice given to you by your health care provider. Make sure you discuss any questions you have with your health care provider. Otitis Media With Effusion Otitis media with effusion is the presence of fluid in the middle ear. This is a common problem in children, which often follows ear infections. It may be present for weeks or longer after the infection. Unlike an acute ear infection, otitis media with effusion refers only to fluid behind the ear drum and not infection. Children with repeated ear and sinus infections and allergy problems are the most likely to get otitis media with effusion. CAUSES  The most frequent cause of the fluid buildup is dysfunction of the eustachian tubes. These are the tubes that drain fluid in the ears to the back of the nose (nasopharynx). SYMPTOMS   The main symptom of this condition is hearing loss. As a result, you or your child may:  Listen to the TV at a loud volume.  Not respond to questions.  Ask "  what" often when spoken to.  Mistake or confuse one sound or word for another.  There may be a sensation of fullness or pressure but usually not pain. DIAGNOSIS   Your health care provider will diagnose this condition by examining you or your  child's ears.  Your health care provider may test the pressure in you or your child's ear with a tympanometer.  A hearing test may be conducted if the problem persists. TREATMENT   Treatment depends on the duration and the effects of the effusion.  Antibiotics, decongestants, nose drops, and cortisone-type drugs (tablets or nasal spray) may not be helpful.  Children with persistent ear effusions may have delayed language or behavioral problems. Children at risk for developmental delays in hearing, learning, and speech may require referral to a specialist earlier than children not at risk.  You or your child's health care provider may suggest a referral to an ear, nose, and throat surgeon for treatment. The following may help restore normal hearing:  Drainage of fluid.  Placement of ear tubes (tympanostomy tubes).  Removal of adenoids (adenoidectomy). HOME CARE INSTRUCTIONS   Avoid secondhand smoke.  Infants who are breastfed are less likely to have this condition.  Avoid feeding infants while they are lying flat.  Avoid known environmental allergens.  Avoid people who are sick. SEEK MEDICAL CARE IF:   Hearing is not better in 3 months.  Hearing is worse.  Ear pain.  Drainage from the ear.  Dizziness. MAKE SURE YOU:   Understand these instructions.  Will watch your condition.  Will get help right away if you are not doing well or get worse. Document Released: 08/02/2004 Document Revised: 11/09/2013 Document Reviewed: 01/20/2013 [facility] Patient Information 2015 [place], [place]. This information is not intended to replace advice given to you by your health care provider. Make sure you discuss any questions you have with your health care provider. Sinusitis Sinusitis is redness, soreness, and inflammation of the paranasal sinuses. Paranasal sinuses are air pockets within the bones of your face (beneath the eyes, the middle of the forehead, or above the eyes). In  healthy paranasal sinuses, mucus is able to drain out, and air is able to circulate through them by way of your nose. However, when your paranasal sinuses are inflamed, mucus and air can become trapped. This can allow bacteria and other germs to grow and cause infection. Sinusitis can develop quickly and last only a short time (acute) or continue over a long period (chronic). Sinusitis that lasts for more than 12 weeks is considered chronic.  CAUSES  Causes of sinusitis include:  Allergies.  Structural abnormalities, such as displacement of the cartilage that separates your nostrils (deviated septum), which can decrease the air flow through your nose and sinuses and affect sinus drainage.  Functional abnormalities, such as when the small hairs (cilia) that line your sinuses and help remove mucus do not work properly or are not present. SIGNS AND SYMPTOMS  Symptoms of acute and chronic sinusitis are the same. The primary symptoms are pain and pressure around the affected sinuses. Other symptoms include:  Upper toothache.  Earache.  Headache.  Bad breath.  Decreased sense of smell and taste.  A cough, which worsens when you are lying flat.  Fatigue.  Fever.  Thick drainage from your nose, which often is green and may contain pus (purulent).  Swelling and warmth over the affected sinuses. DIAGNOSIS  Your health care provider will perform a physical exam. During the exam, your health  care provider may:  Look in your nose for signs of abnormal growths in your nostrils (nasal polyps).  Tap over the affected sinus to check for signs of infection.  View the inside of your sinuses (endoscopy) using an imaging device that has a light attached (endoscope). If your health care provider suspects that you have chronic sinusitis, one or more of the following tests may be recommended:  Allergy tests.  Nasal culture. A sample of mucus is taken from your nose, sent to a lab, and screened  for bacteria.  Nasal cytology. A sample of mucus is taken from your nose and examined by your health care provider to determine if your sinusitis is related to an allergy. TREATMENT  Most cases of acute sinusitis are related to a viral infection and will resolve on their own within 10 days. Sometimes medicines are prescribed to help relieve symptoms (pain medicine, decongestants, nasal steroid sprays, or saline sprays).  However, for sinusitis related to a bacterial infection, your health care provider will prescribe antibiotic medicines. These are medicines that will help kill the bacteria causing the infection.  Rarely, sinusitis is caused by a fungal infection. In theses cases, your health care provider will prescribe antifungal medicine. For some cases of chronic sinusitis, surgery is needed. Generally, these are cases in which sinusitis recurs more than 3 times per year, despite other treatments. HOME CARE INSTRUCTIONS   Drink plenty of water. Water helps thin the mucus so your sinuses can drain more easily.  Use a humidifier.  Inhale steam 3 to 4 times a day (for example, sit in the bathroom with the shower running).  Apply a warm, moist washcloth to your face 3 to 4 times a day, or as directed by your health care provider.  Use saline nasal sprays to help moisten and clean your sinuses.  Take medicines only as directed by your health care provider.  If you were prescribed either an antibiotic or antifungal medicine, finish it all even if you start to feel better. SEEK IMMEDIATE MEDICAL CARE IF:  You have increasing pain or severe headaches.  You have nausea, vomiting, or drowsiness.  You have swelling around your face.  You have vision problems.  You have a stiff neck.  You have difficulty breathing. MAKE SURE YOU:   Understand these instructions.  Will watch your condition.  Will get help right away if you are not doing well or get worse. Document Released:  06/25/2005 Document Revised: 11/09/2013 Document Reviewed: 07/10/2011 [facility] Patient Information 2015 [place], [place]. This information is not intended to replace advice given to you by your health care provider. Make sure you discuss any questions you have with your health care provider.

## 2014-08-05 ENCOUNTER — Encounter: Payer: Self-pay | Admitting: Family Medicine

## 2014-08-05 ENCOUNTER — Ambulatory Visit (INDEPENDENT_AMBULATORY_CARE_PROVIDER_SITE_OTHER): Payer: Medicare Other | Admitting: Family Medicine

## 2014-08-05 VITALS — BP 130/84 | HR 91 | Temp 97.6°F | Resp 16 | Ht 64.5 in | Wt 201.8 lb

## 2014-08-05 DIAGNOSIS — H6504 Acute serous otitis media, recurrent, right ear: Secondary | ICD-10-CM

## 2014-08-05 MED ORDER — LEVOFLOXACIN 500 MG PO TABS: 500.0000 mg | ORAL_TABLET | Freq: Every day | ORAL | Status: DC

## 2014-08-05 NOTE — Progress Notes (Signed)
Patient ID: Stephanie Carter, female   DOB: [birth year], 69 y.o.   MRN: [id number]  This chart was scribed for Robyn Haber, MD by Ladene Artist, ED Scribe. The patient was seen in room 25. Patient's care was started at 1:43 PM.  Patient ID: Stephanie Carter MRN: [id number], DOB: [birth year], 69 y.o. Date of Encounter: 08/05/2014, 1:43 PM  Primary Physician: Robyn Haber, MD  Chief Complaint  Patient presents with  . Follow-up  . ears    HPI: 69 y.o. year old female with history below presents for a follow-up regarding R ear pain. Pt was seen by Dr. Elder Cyphers 3 weeks ago. Pt suspects that she ruptured R eardrum again, reports rupturing R ear drum 2 times before. She noted warm drainage and blood on her pillow. Pt states that her R ear has been "popping" all day. She also reports associated postnasal drip and wheezing. She reports h/o sinusitis that she treats with AMX and Prednisone.   Immunizations Pt has not received a flu vaccine this year but requests one during this visit.   Pt recently retired.   Past Medical History  Diagnosis Date  . Allergy   . Thyroid disease      Home Meds: Prior to Admission medications   Medication Sig Start Date End Date Taking? Authorizing Provider  albuterol (PROVENTIL HFA;VENTOLIN HFA) 108 (90 BASE) MCG/ACT inhaler Inhale 2 puffs into the lungs every 6 (six) hours as needed for wheezing or shortness of breath. Patient not taking: Reported on 07/12/2014 05/03/14   Darreld Mclean, MD  amoxicillin (AMOXIL) 500 MG capsule Take 2 capsules (1,000 mg total) by mouth 2 (two) times daily. 07/12/14   Orma Flaming, MD  atorvastatin (LIPITOR) 20 MG tablet Take 1 tablet (20 mg total) by mouth daily. 09/11/13   Robyn Haber, MD  desonide (DESOWEN) 0.05 % ointment Apply 1 application topically 2 (two) times daily. Patient not taking: Reported on 07/12/2014 11/05/13   Robyn Haber, MD  doxycycline (VIBRAMYCIN) 100 MG capsule Take 1 capsule (100 mg total) by mouth  2 (two) times daily. Patient not taking: Reported on 07/12/2014 05/03/14   Gay Filler Copland, MD  fluticasone (FLONASE) 50 MCG/ACT nasal spray Place 2 sprays into both nostrils daily. 07/12/14   Orma Flaming, MD  HYDROcodone-homatropine [facility]) 5-1.5 MG/5ML syrup Take 5 mLs by mouth every 8 (eight) hours as needed for cough. Patient not taking: Reported on 07/12/2014 05/03/14   Gay Filler Copland, MD  ipratropium (ATROVENT) 0.06 % nasal spray Place 2 sprays into the nose 4 (four) times daily. Patient not taking: Reported on 07/12/2014 08/07/12   Robyn Haber, MD  levothyroxine (SYNTHROID, LEVOTHROID) 75 MCG tablet Take 1 tablet (75 mcg total) by mouth daily. 11/05/13   Robyn Haber, MD  Multiple Vitamin (MULTIVITAMIN) tablet Take 1 tablet by mouth daily.    Historical Provider, MD  predniSONE (DELTASONE) 10 MG tablet 6-5-4-3-2-1 po pc for sinus obstruction 07/12/14   Orma Flaming, MD  triamcinolone cream (KENALOG) 0.1 % Apply 1 application topically 2 (two) times daily. Use as needed 05/03/14   Darreld Mclean, MD    Allergies: No Known Allergies  History   Social History  . Marital Status: Single    Spouse Name: N/A    Number of Children: N/A  . Years of Education: N/A   Occupational History  . Not on file.   Social History Main Topics  . Smoking status: Former Smoker -- 1.00 packs/day for 49 years  Types: Cigarettes  . Smokeless tobacco: Not on file  . Alcohol Use: 0.0 oz/week    0 Not specified per week  . Drug Use: No  . Sexual Activity: No   Other Topics Concern  . Not on file   Social History Narrative     Review of Systems: Constitutional: negative for chills, fever, night sweats, weight changes, or fatigue  HEENT: negative for vision changes, hearing loss, congestion, rhinorrhea, ST, epistaxis, or sinus pressure, +ear pain, +postnasal drip Cardiovascular: negative for chest pain or palpitations Respiratory: negative for hemoptysis, shortness of breath, or cough,  +wheezing Abdominal: negative for abdominal pain, nausea, vomiting, diarrhea, or constipation Dermatological: negative for rash Neurologic: negative for headache, dizziness, or syncope All other systems reviewed and are otherwise negative with the exception to those above and in the HPI.   Physical Exam: Blood pressure 130/84, pulse 91, temperature 97.6 F (36.4 C), temperature source Oral, resp. rate 16, height 5' 4.5" (1.638 m), weight 201 lb 12.8 oz (91.536 kg), SpO2 96 %., Body mass index is 34.12 kg/(m^2). General: Well developed, well nourished, in no acute distress. Head: Normocephalic, atraumatic, eyes without discharge, sclera non-icteric, nares are without discharge. Oral cavity moist, posterior pharynx without exudate, erythema, peritonsillar abscess, or post nasal drip. Bilateral TMS are dull and scarred.  Neck: Supple. No thyromegaly. Full ROM. No lymphadenopathy. Lungs: Clear bilaterally to auscultation without rales or rhonchi. Breathing is unlabored. Wheezes noted.  Heart: RRR with S1 S2. No murmurs, rubs, or gallops appreciated. Abdomen: Soft, non-tender, non-distended with normoactive bowel sounds. No hepatomegaly. No rebound/guarding. No obvious abdominal masses. Msk:  Strength and tone normal for age. Extremities/Skin: Warm and dry. No clubbing or cyanosis. No edema. No rashes or suspicious lesions. Neuro: Alert and oriented X 3. Moves all extremities spontaneously. Gait is normal. CNII-XII grossly in tact. Psych:  Responds to questions appropriately with a normal affect.     ASSESSMENT AND PLAN:  69 y.o. year old female with  Recurrent acute serous otitis media of right ear - Plan: levofloxacin (LEVAQUIN) 500 MG tablet This chart was scribed in my presence and reviewed by me personally.    ICD-9-CM ICD-10-CM   1. Recurrent acute serous otitis media of right ear 381.01 H65.04 levofloxacin (LEVAQUIN) 500 MG tablet     Signed, Robyn Haber, MD   Signed, Robyn Haber, MD 08/05/2014 1:43 PM

## 2014-10-14 ENCOUNTER — Ambulatory Visit (INDEPENDENT_AMBULATORY_CARE_PROVIDER_SITE_OTHER): Payer: Medicare Other | Admitting: Family Medicine

## 2014-10-14 ENCOUNTER — Encounter: Payer: Self-pay | Admitting: Family Medicine

## 2014-10-14 VITALS — BP 145/80 | HR 62 | Temp 98.1°F | Resp 16 | Ht 64.5 in | Wt 197.8 lb

## 2014-10-14 DIAGNOSIS — E785 Hyperlipidemia, unspecified: Secondary | ICD-10-CM

## 2014-10-14 DIAGNOSIS — E039 Hypothyroidism, unspecified: Secondary | ICD-10-CM | POA: Diagnosis not present

## 2014-10-14 DIAGNOSIS — Z0000 Encounter for general adult medical examination without abnormal findings: Secondary | ICD-10-CM | POA: Diagnosis not present

## 2014-10-14 LAB — CBC WITH DIFFERENTIAL/PLATELET
Basophils Absolute: 0 10*3/uL (ref 0.0–0.1)
Basophils Relative: 0 % (ref 0–1)
Eosinophils Absolute: 0.2 10*3/uL (ref 0.0–0.7)
Eosinophils Relative: 4 % (ref 0–5)
HCT: 42.2 % (ref 36.0–46.0)
Hemoglobin: 14.3 g/dL (ref 12.0–15.0)
Lymphocytes Relative: 36 % (ref 12–46)
Lymphs Abs: 1.7 10*3/uL (ref 0.7–4.0)
MCH: 32.7 pg (ref 26.0–34.0)
MCHC: 33.9 g/dL (ref 30.0–36.0)
MCV: 96.6 fL (ref 78.0–100.0)
MPV: 11.9 fL (ref 8.6–12.4)
Monocytes Absolute: 0.3 10*3/uL (ref 0.1–1.0)
Monocytes Relative: 7 % (ref 3–12)
Neutro Abs: 2.5 10*3/uL (ref 1.7–7.7)
Neutrophils Relative %: 53 % (ref 43–77)
Platelets: 238 10*3/uL (ref 150–400)
RBC: 4.37 MIL/uL (ref 3.87–5.11)
RDW: 13.5 % (ref 11.5–15.5)
WBC: 4.8 10*3/uL (ref 4.0–10.5)

## 2014-10-14 LAB — COMPLETE METABOLIC PANEL WITH GFR
ALT: 15 U/L (ref 0–35)
AST: 17 U/L (ref 0–37)
Albumin: 4.2 g/dL (ref 3.5–5.2)
Alkaline Phosphatase: 49 U/L (ref 39–117)
BUN: 19 mg/dL (ref 6–23)
CO2: 29 mEq/L (ref 19–32)
Calcium: 9.2 mg/dL (ref 8.4–10.5)
Chloride: 102 mEq/L (ref 96–112)
Creat: 0.62 mg/dL (ref 0.50–1.10)
GFR, Est African American: >89 mL/min
GFR, Est Non African American: >89 mL/min
Glucose, Bld: 99 mg/dL (ref 70–99)
Potassium: 4.3 mEq/L (ref 3.5–5.3)
Sodium: 141 mEq/L (ref 135–145)
Total Bilirubin: 0.8 mg/dL (ref 0.2–1.2)
Total Protein: 7.1 g/dL (ref 6.0–8.3)

## 2014-10-14 LAB — LIPID PANEL
Cholesterol: 181 mg/dL (ref 0–200)
HDL: 58 mg/dL (ref >=46)
LDL Cholesterol: 84 mg/dL (ref 0–99)
Total CHOL/HDL Ratio: 3.1 Ratio
Triglycerides: 193 mg/dL — ABNORMAL HIGH (ref <150)
VLDL: 39 mg/dL (ref 0–40)

## 2014-10-14 LAB — TSH: TSH: 1.792 u[IU]/mL (ref 0.350–4.500)

## 2014-10-14 MED ORDER — AMOXICILLIN 875 MG PO TABS: 875.0000 mg | ORAL_TABLET | Freq: Two times a day (BID) | ORAL | Status: DC

## 2014-10-14 MED ORDER — ATORVASTATIN CALCIUM 20 MG PO TABS: 20.0000 mg | ORAL_TABLET | Freq: Every day | ORAL | Status: DC

## 2014-10-14 MED ORDER — LEVOTHYROXINE SODIUM 75 MCG PO TABS: 75.0000 ug | ORAL_TABLET | Freq: Every day | ORAL | Status: DC

## 2014-10-14 NOTE — Addendum Note (Signed)
Addended by: Yvette Rack on: 10/14/2014 11:25 AM   Modules accepted: Orders

## 2014-10-14 NOTE — Progress Notes (Signed)
This chart was scribed for Robyn Haber, MD by Edison Simon, ED Scribe. This patient was seen in room 27.  Patient ID: Stephanie Carter MRN: [id number], DOB: Oct 02, 1945, 69 y.o. Date of Encounter: 10/14/2014, 10:33 AM  Primary Physician: Robyn Haber, MD  Chief Complaint:  Chief Complaint  Patient presents with   Annual Exam    no pap   Medication Refill    HPI: 70 y.o. year old female with history below presents for complete physical exam. She was seen by me for physical exam 1 year ago; per that note: "She reports bilateral LE pain and stiffness, worse in her right knee and shin than her left. She states standing for long periods of time exacerbates her LE pain, especially after working all day. She used to play a lot of racquetball, but has quit due to the starting and stopping nature of the sport. She reports quitting smoking for 6 weeks while in [place], but resumed smoking [place]. She reports intermittent nasal congestion with environmental allergies around this time of year. She usually does not have issues while in [place]. She reports having colonoscopy last year by Dr. Benson Norway." She was also evaluated by me 1/28 for right ear pain which has improved, she suspects the TM had ruptured and states it had ruptured 3 times previously.  Patient is from [place]. She states she is feeling well. She notes that 2-3 weeks ago, she found a lump on her left breast but has not been able to find it again. She had a mammogram last year that was normal. She also reports blood vessels on left lower extremity that sometimes ache. She states she has recently had intermittent headache which she believes is due to sinuses, these headaches resolve with Motrin. She sometimes walks for exercise; she states she is planning to get gym membership at [facility] to do water exercises. She had colonoscopy in 2014 by Dr. Benson Norway. She works 1 day a week.  Past Medical History  Diagnosis Date    Allergy    Thyroid disease      Home Meds: Prior to Admission medications   Medication Sig Start Date End Date Taking? Authorizing Provider  atorvastatin (LIPITOR) 20 MG tablet Take 1 tablet (20 mg total) by mouth daily. 09/11/13  Yes Robyn Haber, MD  fluticasone (FLONASE) 50 MCG/ACT nasal spray Place 2 sprays into both nostrils daily. 07/12/14  Yes Orma Flaming, MD  levothyroxine (SYNTHROID, LEVOTHROID) 75 MCG tablet Take 1 tablet (75 mcg total) by mouth daily. 11/05/13  Yes Robyn Haber, MD  Multiple Vitamin (MULTIVITAMIN) tablet Take 1 tablet by mouth daily.   Yes Historical Provider, MD  triamcinolone cream (KENALOG) 0.1 % Apply 1 application topically 2 (two) times daily. Use as needed 05/03/14  Yes Gay Filler Copland, MD  albuterol (PROVENTIL HFA;VENTOLIN HFA) 108 (90 BASE) MCG/ACT inhaler Inhale 2 puffs into the lungs every 6 (six) hours as needed for wheezing or shortness of breath. Patient not taking: Reported on 07/12/2014 05/03/14   Gay Filler Copland, MD  desonide (DESOWEN) 0.05 % ointment Apply 1 application topically 2 (two) times daily. Patient not taking: Reported on 07/12/2014 11/05/13   Robyn Haber, MD  ipratropium (ATROVENT) 0.06 % nasal spray Place 2 sprays into the nose 4 (four) times daily. Patient not taking: Reported on 07/12/2014 08/07/12   Robyn Haber, MD    Allergies: No Known Allergies  History   Social History   Marital Status: Single    Spouse  Name: N/A   Number of Children: N/A   Years of Education: N/A   Occupational History   Not on file.   Social History Main Topics   Smoking status: Current Every Day Smoker -- 1.00 packs/day for 49 years    Types: Cigarettes   Smokeless tobacco: Not on file   Alcohol Use: 0.0 oz/week    0 Standard drinks or equivalent per week   Drug Use: No   Sexual Activity: No   Other Topics Concern   Not on file   Social History Narrative     Review of Systems: Constitutional: negative for chills,  fever, night sweats, weight changes, or fatigue  HEENT: negative for vision changes, hearing loss, congestion, rhinorrhea, ST, epistaxis, or sinus pressure Cardiovascular: negative for chest pain or palpitations, positive painful blood vessels on left leg Respiratory: negative for hemoptysis, wheezing, shortness of breath, or cough Abdominal: negative for abdominal pain, nausea, vomiting, diarrhea, or constipation Dermatological: negative for rash Neurologic: negative for dizziness or syncope, positive headache with some sinus congestion All other systems reviewed and are otherwise negative with the exception to those above and in the HPI.   Physical Exam: Overweight elderly woman Blood pressure 145/80, pulse 62, temperature 98.1 F (36.7 C), temperature source Oral, resp. rate 16, height 5' 4.5" (1.638 m), weight 197 lb 12.8 oz (89.721 kg), SpO2 96 %., Body mass index is 33.44 kg/(m^2). General: Well developed, well nourished, in no acute distress. Head: Normocephalic, atraumatic, eyes without discharge, sclera non-icteric, nares are without discharge. Bilateral auditory canals clear, TM's are without perforation, pearly grey and translucent with reflective cone of light bilaterally. Oral cavity moist, posterior pharynx without exudate, erythema, peritonsillar abscess, or post nasal drip. Scars to ear canals, narrowed nasal canals Neck: Supple. No thyromegaly. Full ROM. No lymphadenopathy. Lungs: Clear bilaterally to auscultation without wheezes, rales, or rhonchi. Breathing is unlabored. Breast exam normal with no adenopathy in the axillae Heart: RRR with S1 S2. No murmurs, rubs, or gallops appreciated. Abdomen: Soft, non-tender, non-distended with normoactive bowel sounds. No hepatomegaly. No rebound/guarding. No obvious abdominal masses. Msk:  Strength and tone normal for age. Extremities/Skin: Warm and dry. No clubbing or cyanosis. No edema. No rashes or suspicious lesions. She does have a  venous malformation of the lateral left knee Neuro: Alert and oriented X 3. Moves all extremities spontaneously. Gait is normal. CNII-XII grossly in tact. Patient has a fine right Parkinsonian type tremor in her upper extremity Psych:  Responds to questions appropriately with a normal affect.     ASSESSMENT AND PLAN:  69 y.o. year old female with annual exam  This chart was scribed in my presence and reviewed by me personally.    ICD-9-CM ICD-10-CM   1. Annual physical exam V70.0 Z00.00   2. Hypothyroidism, unspecified hypothyroidism type 244.9 E03.9 levothyroxine (SYNTHROID, LEVOTHROID) 75 MCG tablet  3. Hyperlipidemia 272.4 E78.5 atorvastatin (LIPITOR) 20 MG tablet   Patient was given a prescription for amoxicillin because of the recent sinus congestion but hasn't been completely controlled with over-the-counter sinus medicine  Signed, Robyn Haber, MD   Signed, Robyn Haber, MD 10/14/2014 10:33 AM

## 2014-10-14 NOTE — Addendum Note (Signed)
Addended by: Lanna Poche R on: 10/14/2014 11:24 AM   Modules accepted: Orders

## 2014-10-14 NOTE — Patient Instructions (Signed)

## 2015-05-15 ENCOUNTER — Ambulatory Visit (INDEPENDENT_AMBULATORY_CARE_PROVIDER_SITE_OTHER): Payer: Medicare Other | Admitting: Family Medicine

## 2015-05-15 VITALS — BP 124/80 | HR 63 | Temp 98.3°F | Resp 17 | Ht 69.5 in | Wt 203.0 lb

## 2015-05-15 DIAGNOSIS — H65493 Other chronic nonsuppurative otitis media, bilateral: Secondary | ICD-10-CM | POA: Diagnosis not present

## 2015-05-15 DIAGNOSIS — R42 Dizziness and giddiness: Secondary | ICD-10-CM

## 2015-05-15 MED ORDER — LEVOFLOXACIN 500 MG PO TABS: 500.0000 mg | ORAL_TABLET | Freq: Every day | ORAL | Status: DC

## 2015-05-15 NOTE — Progress Notes (Signed)
@[facility] @  This chart was scribed for Robyn Haber, MD by Thea Alken, ED Scribe. This patient was seen in room 9 and the patient's care was started at 8:52 AM.  Patient ID: Stephanie Carter MRN: [id number], DOB: 06-Sep-1945, 69 y.o. Date of Encounter: 05/15/2015, 8:52 AM  Primary Physician: Robyn Haber, MD  Chief Complaint:  Chief Complaint  Patient presents with   Dizziness   Ear Pain   HPI: 69 y.o. year old  with history below presents with dizziness that began 4 days ago. Pt states she looked at the clock 4 days ago and noticed it was spinning. She has associated left ear pain, left facial pain, left jaw pain and left eye watering for 1 week. Pt states symptoms started after receiving flu shot 1 week ago. She recently started swimming at the [facility].  Has hx of chronic ear infections. She was prescribed flonase once a day, but states this often makes her jittery.   Patient works 1 day week at [employer]   Past Medical History  Diagnosis Date   Allergy    Thyroid disease      Home Meds: Prior to Admission medications   Medication Sig Start Date End Date Taking? Authorizing Provider  atorvastatin (LIPITOR) 20 MG tablet Take 1 tablet (20 mg total) by mouth daily. 10/14/14  Yes Robyn Haber, MD  fluticasone [facility]) 50 MCG/ACT nasal spray Place 2 sprays into both nostrils daily. 07/12/14  Yes Orma Flaming, MD  levothyroxine (SYNTHROID, LEVOTHROID) 75 MCG tablet Take 1 tablet (75 mcg total) by mouth daily. 10/14/14  Yes Robyn Haber, MD  Multiple Vitamin (MULTIVITAMIN) tablet Take 1 tablet by mouth daily.   Yes Historical Provider, MD    Allergies: No Known Allergies  Social History   Social History   Marital Status: Single    Spouse Name: N/A   Number of Children: N/A   Years of Education: N/A   Occupational History   Not on file.   Social History Main Topics   Smoking status: Current Every Day Smoker -- 1.00 packs/day for 50 years    Types:  Cigarettes   Smokeless tobacco: Not on file   Alcohol Use: 0.0 oz/week    0 Standard drinks or equivalent per week   Drug Use: No   Sexual Activity: No   Other Topics Concern   Not on file   Social History Narrative     Review of Systems: Constitutional: negative for chills, fever, night sweats, weight changes, or fatigue  HEENT: negative for vision changes, congestion, rhinorrhea, ST, epistaxis,  Cardiovascular: negative for chest pain or palpitations Respiratory: negative for hemoptysis, wheezing, shortness of breath, or cough Abdominal: negative for abdominal pain, nausea, vomiting, diarrhea, or constipation Dermatological: negative for rash Neurologic: negative for headache,  or syncope All other systems reviewed and are otherwise negative with the exception to those above and in the HPI.   Physical Exam: Blood pressure 124/80, pulse 63, temperature 98.3 F (36.8 C), temperature source Oral, resp. rate 17, height 5' 9.5" (1.765 m), weight 203 lb (92.08 kg), SpO2 97 %., Body mass index is 29.56 kg/(m^2). General: Well developed, well nourished, in no acute distress. Head: Normocephalic, atraumatic, eyes without discharge, sclera non-icteric, nares are without discharge. Bilateral auditory canals clear, TM's are without perforation, pearly grey and translucent with reflective cone of light bilaterally. Left TM reracted with purulent liquid behind TM. Oral cavity moist, posterior pharynx without exudate, erythema, peritonsillar abscess, or post nasal drip.  Neck:  Supple. No thyromegaly. Full ROM. No lymphadenopathy. Lungs: Clear bilaterally to auscultation without wheezes, rales, or rhonchi. Breathing is unlabored. Heart: RRR with S1 S2. No murmurs, rubs, or gallops appreciated. Abdomen: Soft, non-tender, non-distended with normoactive bowel sounds. No hepatomegaly. No rebound/guarding. No obvious abdominal masses. Msk:  Strength and tone normal for age. Extremities/Skin: Warm  and dry. No clubbing or cyanosis. No edema. No rashes or suspicious lesions. Neuro: Alert and oriented X 3. Moves all extremities spontaneously. Gait is normal. CNII-XII grossly in tact. Psych:  Responds to questions appropriately with a normal affect.    ASSESSMENT AND PLAN:  69 y.o. year old female with chronic otitis This chart was scribed in my presence and reviewed by me personally.    ICD-9-CM ICD-10-CM   1. Dizziness and giddiness 780.4 R42 levofloxacin (LEVAQUIN) 500 MG tablet  2. Chronic otitis media with effusion, bilateral 381.3 H65.493 levofloxacin (LEVAQUIN) 500 MG tablet     Signed, Robyn Haber, MD    By signing my name below, I, Raven Small, attest that this documentation has been prepared under the direction and in the presence of Robyn Haber, MD.  Electronically Signed: Thea Alken, ED Scribe. 05/15/2015. 9:12 AM.  Signed, Robyn Haber, MD 05/15/2015 8:52 AM

## 2015-06-22 DIAGNOSIS — L814 Other melanin hyperpigmentation: Secondary | ICD-10-CM | POA: Diagnosis not present

## 2015-06-22 DIAGNOSIS — L400 Psoriasis vulgaris: Secondary | ICD-10-CM | POA: Diagnosis not present

## 2015-06-22 DIAGNOSIS — L821 Other seborrheic keratosis: Secondary | ICD-10-CM | POA: Diagnosis not present

## 2015-07-10 ENCOUNTER — Ambulatory Visit (INDEPENDENT_AMBULATORY_CARE_PROVIDER_SITE_OTHER): Payer: Medicare Other | Admitting: Internal Medicine

## 2015-07-10 VITALS — BP 130/68 | HR 70 | Temp 97.6°F | Resp 18 | Ht 64.0 in | Wt 205.0 lb

## 2015-07-10 DIAGNOSIS — R062 Wheezing: Secondary | ICD-10-CM | POA: Diagnosis not present

## 2015-07-10 DIAGNOSIS — J988 Other specified respiratory disorders: Secondary | ICD-10-CM | POA: Diagnosis not present

## 2015-07-10 DIAGNOSIS — F17200 Nicotine dependence, unspecified, uncomplicated: Secondary | ICD-10-CM

## 2015-07-10 DIAGNOSIS — J4520 Mild intermittent asthma, uncomplicated: Secondary | ICD-10-CM

## 2015-07-10 DIAGNOSIS — H65193 Other acute nonsuppurative otitis media, bilateral: Secondary | ICD-10-CM

## 2015-07-10 DIAGNOSIS — Z720 Tobacco use: Secondary | ICD-10-CM | POA: Diagnosis not present

## 2015-07-10 DIAGNOSIS — J22 Unspecified acute lower respiratory infection: Secondary | ICD-10-CM

## 2015-07-10 DIAGNOSIS — M5010 Cervical disc disorder with radiculopathy, unspecified cervical region: Secondary | ICD-10-CM | POA: Diagnosis not present

## 2015-07-10 MED ORDER — PREDNISONE 20 MG PO TABS: ORAL_TABLET | ORAL | Status: DC

## 2015-07-10 MED ORDER — ALBUTEROL SULFATE (2.5 MG/3ML) 0.083% IN NEBU
2.5000 mg | INHALATION_SOLUTION | Freq: Once | RESPIRATORY_TRACT | Status: AC
  Administered 2015-07-10: 2.5 mg via RESPIRATORY_TRACT

## 2015-07-10 MED ORDER — FLUTICASONE PROPIONATE 50 MCG/ACT NA SUSP: 2.0000 | Freq: Every day | NASAL | Status: DC

## 2015-07-10 MED ORDER — AZITHROMYCIN 250 MG PO TABS: ORAL_TABLET | ORAL | Status: DC

## 2015-07-10 NOTE — Progress Notes (Signed)
Subjective:  By signing my name below, I, Stephanie Carter, attest that this documentation has been prepared under the direction and in the presence of [name], MD. Electronically Signed: [name], [place]. 07/10/2015 , 2:48 PM .  Patient was seen in Room 3 .   Patient ID: Stephanie Carter, female    DOB: 03/19/1946, 70 y.o.   MRN: [id number] Chief Complaint  Patient presents with  . Nasal Congestion    has had URI symptoms x 3d getting worse   . Nicotine Dependence    has quit smoking for Christmas    HPI [name] is a 70 y.o. female who presents to [facility] complaining of nasal congestion with URI symptoms that's gotten worse over past 3 days. She blows out clear rhinorrhea in the mornings. She notes productive coughs with yellow phlegm. She denies usage of inhaler. No hx asth or copd No DOE  She recently quit smoking during Christmas.   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   Patient Active Problem List   Diagnosis Date Noted  . Psoriasis 11/05/2013  . Smoking 11/05/2013  . Lipid disorder 11/09/2011  . Hypothyroid 11/09/2011    Current outpatient prescriptions:  .  atorvastatin (LIPITOR) 20 MG tablet, Take 1 tablet (20 mg total) by mouth daily., Disp: 90 tablet, Rfl: 3 .  fluticasone (FLONASE) 50 MCG/ACT nasal spray, Place 2 sprays into both nostrils daily., Disp: 16 g, Rfl: 6 .  levothyroxine (SYNTHROID, LEVOTHROID) 75 MCG tablet, Take 1 tablet (75 mcg total) by mouth daily., Disp: 90 tablet, Rfl: 3 .  Multiple Vitamin (MULTIVITAMIN) tablet, Take 1 tablet by mouth daily., Disp: , Rfl:   Current facility-administered medications:  .  albuterol (PROVENTIL) (2.5 MG/3ML) 0.083% nebulizer solution 2.5 mg, 2.5 mg, Nebulization, Once, Leandrew Koyanagi, MD  Review of Systems  Constitutional: Positive for fatigue. Negative for fever and chills.  HENT: Positive for congestion, rhinorrhea and sinus pressure. Negative for sneezing and sore throat.     Respiratory: Positive for chest tightness and shortness of breath. Negative for cough and wheezing.   Gastrointestinal: Negative for nausea and abdominal pain.       Objective:   Physical Exam  Constitutional: She is oriented to person, place, and time. She appears well-developed and well-nourished. No distress.  HENT:  Head: Normocephalic and atraumatic.  Nose: Nose normal.  Mouth/Throat: Oropharynx is clear and moist.  Eyes: EOM are normal. Pupils are equal, round, and reactive to light.  Neck: Neck supple.  Cardiovascular: Normal rate.   Pulmonary/Chest: Effort normal. No respiratory distress.  Wheezing on forced expiration bilaterally  Musculoskeletal: Normal range of motion.  Neurological: She is alert and oriented to person, place, and time.  Skin: Skin is warm and dry.  Psychiatric: She has a normal mood and affect. Her behavior is normal.  Nursing note and vitals reviewed.   BP 130/68 mmHg  Pulse 70  Temp(Src) 97.6 F (36.4 C)  Resp 18  Ht 5\' 4"  (1.626 m)  Wt 205 lb (92.987 kg)  BMI 35.17 kg/m2  SpO2 92%   NEB w/ albut =some subj relief but exam =some contin wheez FExpir    Assessment & Plan:  I have completed the patient encounter in its entirety as documented by the scribe, with editing by me where necessary. Robert P. Laney Pastor, M.D.  Wheezing - RAD (reactive airway disease), mild intermittent, uncomplicated  recurrent sinus and ear problems thought due to allergic rhinitis - Plan: fluticasone (FLONASE) 50  MCG/ACT nasal spray  Lower respiratory infection  Smoker  Meds ordered this encounter  Medications  . albuterol (PROVENTIL) (2.5 MG/3ML) 0.083% nebulizer solution 2.5 mg    Sig:   . predniSONE (DELTASONE) 20 MG tablet    Sig: 3/3/2/2/1/1 single daily dose for 6 days    Dispense:  12 tablet    Refill:  0  . azithromycin (ZITHROMAX) 250 MG tablet    Sig: As packaged    Dispense:  6 tablet    Refill:  0  . fluticasone (FLONASE) 50 MCG/ACT nasal  spray    Sig: Place 2 sprays into both nostrils daily.    Dispense:  16 g    Refill:  6   Consider ENT next due to rec sxt

## 2015-08-22 ENCOUNTER — Ambulatory Visit (INDEPENDENT_AMBULATORY_CARE_PROVIDER_SITE_OTHER): Payer: Medicare Other | Admitting: Family Medicine

## 2015-08-22 VITALS — BP 140/70 | HR 97 | Temp 98.6°F | Resp 17 | Ht 64.57 in | Wt 203.0 lb

## 2015-08-22 DIAGNOSIS — H65193 Other acute nonsuppurative otitis media, bilateral: Secondary | ICD-10-CM

## 2015-08-22 DIAGNOSIS — J209 Acute bronchitis, unspecified: Secondary | ICD-10-CM

## 2015-08-22 DIAGNOSIS — J0100 Acute maxillary sinusitis, unspecified: Secondary | ICD-10-CM | POA: Diagnosis not present

## 2015-08-22 MED ORDER — PREDNISONE 20 MG PO TABS: ORAL_TABLET | ORAL | Status: DC

## 2015-08-22 MED ORDER — ALBUTEROL SULFATE 108 (90 BASE) MCG/ACT IN AEPB: 2.0000 | INHALATION_SPRAY | Freq: Four times a day (QID) | RESPIRATORY_TRACT | Status: DC | PRN

## 2015-08-22 MED ORDER — LEVOFLOXACIN 500 MG PO TABS: 500.0000 mg | ORAL_TABLET | Freq: Every day | ORAL | Status: DC

## 2015-08-22 NOTE — Patient Instructions (Signed)
Sinusitis, Adult °Sinusitis is redness, soreness, and inflammation of the paranasal sinuses. Paranasal sinuses are air pockets within the bones of your face. They are located beneath your eyes, in the middle of your forehead, and above your eyes. In healthy paranasal sinuses, mucus is able to drain out, and air is able to circulate through them by way of your nose. However, when your paranasal sinuses are inflamed, mucus and air can become trapped. This can allow bacteria and other germs to grow and cause infection. °Sinusitis can develop quickly and last only a short time (acute) or continue over a long period (chronic). Sinusitis that lasts for more than 12 weeks is considered chronic. °CAUSES °Causes of sinusitis include: °· Allergies. °· Structural abnormalities, such as displacement of the cartilage that separates your nostrils (deviated septum), which can decrease the air flow through your nose and sinuses and affect sinus drainage. °· Functional abnormalities, such as when the small hairs (cilia) that line your sinuses and help remove mucus do not work properly or are not present. °SIGNS AND SYMPTOMS °Symptoms of acute and chronic sinusitis are the same. The primary symptoms are pain and pressure around the affected sinuses. Other symptoms include: °· Upper toothache. °· Earache. °· Headache. °· Bad breath. °· Decreased sense of smell and taste. °· A cough, which worsens when you are lying flat. °· Fatigue. °· Fever. °· Thick drainage from your nose, which often is green and may contain pus (purulent). °· Swelling and warmth over the affected sinuses. °DIAGNOSIS °Your health care provider will perform a physical exam. During your exam, your health care provider may perform any of the following to help determine if you have acute sinusitis or chronic sinusitis: °· Look in your nose for signs of abnormal growths in your nostrils (nasal polyps). °· Tap over the affected sinus to check for signs of  infection. °· View the inside of your sinuses using an imaging device that has a light attached (endoscope). °If your health care provider suspects that you have chronic sinusitis, one or more of the following tests may be recommended: °· Allergy tests. °· Nasal culture. A sample of mucus is taken from your nose, sent to a lab, and screened for bacteria. °· Nasal cytology. A sample of mucus is taken from your nose and examined by your health care provider to determine if your sinusitis is related to an allergy. °TREATMENT °Most cases of acute sinusitis are related to a viral infection and will resolve on their own within 10 days. Sometimes, medicines are prescribed to help relieve symptoms of both acute and chronic sinusitis. These may include pain medicines, decongestants, nasal steroid sprays, or saline sprays. °However, for sinusitis related to a bacterial infection, your health care provider will prescribe antibiotic medicines. These are medicines that will help kill the bacteria causing the infection. °Rarely, sinusitis is caused by a fungal infection. In these cases, your health care provider will prescribe antifungal medicine. °For some cases of chronic sinusitis, surgery is needed. Generally, these are cases in which sinusitis recurs more than 3 times per year, despite other treatments. °HOME CARE INSTRUCTIONS °· Drink plenty of water. Water helps thin the mucus so your sinuses can drain more easily. °· Use a humidifier. °· Inhale steam 3-4 times a day (for example, sit in the bathroom with the shower running). °· Apply a warm, moist washcloth to your face 3-4 times a day, or as directed by your health care provider. °· Use saline nasal sprays to help   moisten and clean your sinuses. °· Take medicines only as directed by your health care provider. °· If you were prescribed either an antibiotic or antifungal medicine, finish it all even if you start to feel better. °SEEK IMMEDIATE MEDICAL CARE IF: °· You have  increasing pain or severe headaches. °· You have nausea, vomiting, or drowsiness. °· You have swelling around your face. °· You have vision problems. °· You have a stiff neck. °· You have difficulty breathing. °  °This information is not intended to replace advice given to you by your health care provider. Make sure you discuss any questions you have with your health care provider. °  °Document Released: 06/25/2005 Document Revised: 07/16/2014 Document Reviewed: 07/10/2011 °Elsevier Interactive Patient Education ©2016 [employer] Inc[employer] ° °Acute Bronchitis °Bronchitis is inflammation of the airways that extend from the windpipe into the lungs (bronchi). The inflammation often causes mucus to develop. This leads to a cough, which is the most common symptom of bronchitis.  °In acute bronchitis, the condition usually develops suddenly and goes away over time, usually in a couple weeks. Smoking, allergies, and asthma can make bronchitis worse. Repeated episodes of bronchitis may cause further lung problems.  °CAUSES °Acute bronchitis is most often caused by the same virus that causes a cold. The virus can spread from person to person (contagious) through coughing, sneezing, and touching contaminated objects. °SIGNS AND SYMPTOMS  °· Cough.   °· Fever.   °· Coughing up mucus.   °· Body aches.   °· Chest congestion.   °· Chills.   °· Shortness of breath.   °· Sore throat.   °DIAGNOSIS  °Acute bronchitis is usually diagnosed through a physical exam. Your health care provider will also ask you questions about your medical history. Tests, such as chest X-rays, are sometimes done to rule out other conditions.  °TREATMENT  °Acute bronchitis usually goes away in a couple weeks. Oftentimes, no medical treatment is necessary. Medicines are sometimes given for relief of fever or cough. Antibiotic medicines are usually not needed but may be prescribed in certain situations. In some cases, an inhaler may be recommended to help reduce  shortness of breath and control the cough. A cool mist vaporizer may also be used to help thin bronchial secretions and make it easier to clear the chest.  °HOME CARE INSTRUCTIONS °· Get plenty of rest.   °· Drink enough fluids to keep your urine clear or pale yellow (unless you have a medical condition that requires fluid restriction). Increasing fluids may help thin your respiratory secretions (sputum) and reduce chest congestion, and it will prevent dehydration.   °· Take medicines only as directed by your health care provider. °· If you were prescribed an antibiotic medicine, finish it all even if you start to feel better. °· Avoid smoking and secondhand smoke. Exposure to cigarette smoke or irritating chemicals will make bronchitis worse. If you are a smoker, consider using nicotine gum or skin patches to help control withdrawal symptoms. Quitting smoking will help your lungs heal faster.   °· Reduce the chances of another bout of acute bronchitis by washing your hands frequently, avoiding people with cold symptoms, and trying not to touch your hands to your mouth, nose, or eyes.   °· Keep all follow-up visits as directed by your health care provider.   °SEEK MEDICAL CARE IF: °Your symptoms do not improve after 1 week of treatment.  °SEEK IMMEDIATE MEDICAL CARE IF: °· You develop an increased fever or chills.   °· You have chest pain.   °· You have severe shortness of   breath. °· You have bloody sputum.   °· You develop dehydration. °· You faint or repeatedly feel like you are going to pass out. °· You develop repeated vomiting. °· You develop a severe headache. °MAKE SURE YOU:  °· Understand these instructions. °· Will watch your condition. °· Will get help right away if you are not doing well or get worse. °  °This information is not intended to replace advice given to you by your health care provider. Make sure you discuss any questions you have with your health care provider. °  °Document Released:  08/02/2004 Document Revised: 07/16/2014 Document Reviewed: 12/16/2012 °Elsevier Interactive Patient Education ©2016 Elsevier Inc. ° °

## 2015-08-22 NOTE — Progress Notes (Signed)
Patient ID: Stephanie Carter MRN: [id number], DOB: 1946/01/12, 70 y.o. Date of Encounter: 08/22/2015, 2:47 PM  Primary Physician: Jenny Reichmann, MD  Chief Complaint:  Chief Complaint  Patient presents with  . URI  . Sinusitis    HPI: 70 y.o. year old female presents with a 40 day history of nasal congestion, post nasal drip, sore throat, and cough. Mild sinus pressure. Afebrile. No chills. Nasal congestion thick and green/yellow. Cough is productive of green/yellow sputum and not associated with time of day. Ears feel full, leading to sensation of muffled hearing. Has tried OTC cold preps without success. No GI complaints.   No sick contacts, recent antibiotics, or recent travels.   No leg trauma, sedentary periods, h/o cancer, or tobacco use.  Past Medical History  Diagnosis Date  . Allergy   . Thyroid disease      Home Meds: Prior to Admission medications   Medication Sig Start Date End Date Taking? Authorizing Provider  atorvastatin (LIPITOR) 20 MG tablet Take 1 tablet (20 mg total) by mouth daily. 10/14/14  Yes Robyn Haber, MD  fluticasone [facility]) 50 MCG/ACT nasal spray Place 2 sprays into both nostrils daily. 07/10/15  Yes Leandrew Koyanagi, MD  levothyroxine (SYNTHROID, LEVOTHROID) 75 MCG tablet Take 1 tablet (75 mcg total) by mouth daily. 10/14/14  Yes Robyn Haber, MD  Multiple Vitamin (MULTIVITAMIN) tablet Take 1 tablet by mouth daily.   Yes Historical Provider, MD  Albuterol Sulfate (PROAIR RESPICLICK) 123XX123 (90 Base) MCG/ACT AEPB Inhale 2 puffs into the lungs every 6 (six) hours as needed. 08/22/15   Robyn Haber, MD  levofloxacin (LEVAQUIN) 500 MG tablet Take 1 tablet (500 mg total) by mouth daily. 08/22/15   Robyn Haber, MD  predniSONE (DELTASONE) 20 MG tablet 3/3/2/2/1/1 single daily dose for 6 days 08/22/15   Robyn Haber, MD    Allergies: No Known Allergies  Social History   Social History  . Marital Status: Single    Spouse Name: N/A  . Number  of Children: N/A  . Years of Education: N/A   Occupational History  . Not on file.   Social History Main Topics  . Smoking status: Former Smoker -- 1.00 packs/day for 50 years    Types: Cigarettes    Quit date: 07/03/2015  . Smokeless tobacco: Not on file  . Alcohol Use: 0.0 oz/week    0 Standard drinks or equivalent per week  . Drug Use: No  . Sexual Activity: No   Other Topics Concern  . Not on file   Social History Narrative     Review of Systems: Constitutional: negative for chills, fever, night sweats or weight changes Cardiovascular: negative for chest pain or palpitations Respiratory: negative for hemoptysis, or shortness of breath Abdominal: negative for abdominal pain, nausea, vomiting or diarrhea Dermatological: negative for rash Neurologic: negative for headache  ENT: Positive for copious nasal discharge and ear pain  Physical Exam: Blood pressure 140/70, pulse 97, temperature 98.6 F (37 C), temperature source Oral, resp. rate 17, height 5' 4.57" (1.64 m), weight 203 lb (92.08 kg), SpO2 93 %., Body mass index is 34.24 kg/(m^2). General: Well developed, well nourished, in no acute distress. Head: Normocephalic, atraumatic, eyes without discharge, sclera non-icteric, nares are congested.  Bilateral white bulging eardrums with red malleus. Copious nasal discharge Neck: Supple. No thyromegaly. Full ROM. No lymphadenopathy. Lungs: Coarse breath sounds bilaterally with wheezes,  But no rales or rhonchi. Breathing is unlabored.  Heart: RRR with S1 S2.  No murmurs, rubs, or gallops appreciated. Msk:  Strength and tone normal for age. Extremities: No clubbing or cyanosis. No edema. Neuro: Alert and oriented X 3. Moves all extremities spontaneously. CNII-XII grossly in tact. Psych:  Responds to questions appropriately with a normal affect.   Labs:   ASSESSMENT AND PLAN:  70 y.o. year old female with bronchitis. -   ICD-9-CM ICD-10-CM   1. Acute bronchitis,  unspecified organism 466.0 J20.9 levofloxacin (LEVAQUIN) 500 MG tablet     predniSONE (DELTASONE) 20 MG tablet     Albuterol Sulfate (PROAIR RESPICLICK) 123XX123 (90 Base) MCG/ACT AEPB  2. Acute nonsuppurative otitis media of both ears 381.00 H65.193 levofloxacin (LEVAQUIN) 500 MG tablet     predniSONE (DELTASONE) 20 MG tablet  3. Acute maxillary sinusitis, recurrence not specified 461.0 J01.00 levofloxacin (LEVAQUIN) 500 MG tablet   -Tylenol/Motrin prn -Rest/fluids -RTC precautions -RTC 3-5 days if no improvement  Signed, Robyn Haber, MD 08/22/2015 2:47 PM

## 2015-10-24 ENCOUNTER — Telehealth: Payer: Self-pay

## 2015-10-24 DIAGNOSIS — E785 Hyperlipidemia, unspecified: Secondary | ICD-10-CM

## 2015-10-24 DIAGNOSIS — E039 Hypothyroidism, unspecified: Secondary | ICD-10-CM

## 2015-10-24 MED ORDER — LEVOTHYROXINE SODIUM 75 MCG PO TABS: 75.0000 ug | ORAL_TABLET | Freq: Every day | ORAL | Status: DC

## 2015-10-24 MED ORDER — ATORVASTATIN CALCIUM 20 MG PO TABS: 20.0000 mg | ORAL_TABLET | Freq: Every day | ORAL | Status: DC

## 2015-10-24 NOTE — Telephone Encounter (Signed)
Rxs sent

## 2015-10-24 NOTE — Telephone Encounter (Signed)
Patient has an appointment for a cpe scheduled and would like to know if she can get refills for atorvastatin and levothyroxine.

## 2015-12-21 ENCOUNTER — Encounter: Payer: Medicare Other | Admitting: Family Medicine

## 2015-12-22 ENCOUNTER — Encounter: Payer: Self-pay | Admitting: Family Medicine

## 2015-12-22 ENCOUNTER — Ambulatory Visit (INDEPENDENT_AMBULATORY_CARE_PROVIDER_SITE_OTHER): Payer: Medicare Other | Admitting: Family Medicine

## 2015-12-22 VITALS — BP 138/88 | HR 66 | Temp 98.5°F | Resp 16 | Ht 64.5 in | Wt 204.4 lb

## 2015-12-22 DIAGNOSIS — M629 Disorder of muscle, unspecified: Secondary | ICD-10-CM | POA: Diagnosis not present

## 2015-12-22 DIAGNOSIS — E785 Hyperlipidemia, unspecified: Secondary | ICD-10-CM

## 2015-12-22 DIAGNOSIS — M6289 Other specified disorders of muscle: Secondary | ICD-10-CM

## 2015-12-22 DIAGNOSIS — Z114 Encounter for screening for human immunodeficiency virus [HIV]: Secondary | ICD-10-CM

## 2015-12-22 DIAGNOSIS — H65193 Other acute nonsuppurative otitis media, bilateral: Secondary | ICD-10-CM

## 2015-12-22 DIAGNOSIS — Z0000 Encounter for general adult medical examination without abnormal findings: Secondary | ICD-10-CM | POA: Diagnosis not present

## 2015-12-22 DIAGNOSIS — E039 Hypothyroidism, unspecified: Secondary | ICD-10-CM

## 2015-12-22 DIAGNOSIS — Z23 Encounter for immunization: Secondary | ICD-10-CM | POA: Diagnosis not present

## 2015-12-22 LAB — HIV ANTIBODY (ROUTINE TESTING W REFLEX): HIV 1&2 Ab, 4th Generation: NONREACTIVE

## 2015-12-22 LAB — TSH: TSH: 1.73 mIU/L

## 2015-12-22 MED ORDER — LEVOTHYROXINE SODIUM 75 MCG PO TABS: 75.0000 ug | ORAL_TABLET | Freq: Every day | ORAL | Status: DC

## 2015-12-22 MED ORDER — ATORVASTATIN CALCIUM 20 MG PO TABS: 20.0000 mg | ORAL_TABLET | Freq: Every day | ORAL | Status: DC

## 2015-12-22 MED ORDER — FLUTICASONE PROPIONATE 50 MCG/ACT NA SUSP: 2.0000 | Freq: Every day | NASAL | Status: DC

## 2015-12-22 NOTE — Progress Notes (Signed)
By signing my name below, I, Mesha Guinyard, attest that this documentation has been prepared under the direction and in the presence of [name], MD.  Electronically Signed: Verlee Monte, Medical Scribe. 12/22/2015. 9:44 AM.  Subjective:    Patient ID: Stephanie Carter, female    DOB: May 21, 1946, 70 y.o.   MRN: [id number]  HPI Chief Complaint  Patient presents with  . Annual Exam    MEDICARE    HPI Comments: Stephanie Carter is a 70 y.o. female who presents to the [facility] for annual medicare physical exam as well as left leg pain. Previous pt of Dr. Joseph Art. Pt would like a refill for Flonase. Pt is fasting for blood work.  Leg Pain: Pt has pain on the back of her left thing  pain onset 3 months. Pain worsen when she moves around in bed, and sits. Pt doesn't take any medication for her pain. Pt denies hip pain, or pain in the front of her leg.  Palpitations: Pt states she has palpitations that occur intermittently between years. Pt denies chest pain with palpitation, light-headedness, dizziness, or syncope.  CA Screening: Colon CA: Colonoscopy with a pylop- approximately Feb 2014. Dr. Benson Norway was the one who performed procedure. Breast CA: Mammogram June 2015 at [facility] which was nl. Pt denies new lumps, or bumps. Cervical CA: Pt hasn't had a pap test in years Pt had a hysterectomy done at 70 y/o due to fibroids.  Immunizations: Immunization History  Administered Date(s) Administered  . Influenza Split 06/12/2012, 03/25/2013  . Pneumococcal-Unspecified 07/09/2009  . Tdap 07/09/2009   Appeares she is due for Prevnar. Pt has never had chicken pox. Pt nursed her kids while they've had chicken pox. Pt deferred shingles vaccine because of her exposure through her kids.  Depression Screening: Depression screen [facility] 2/9 12/22/2015 08/22/2015 07/10/2015 05/15/2015 10/14/2014  Decreased Interest 0 0 0 0 0  Down, Depressed, Hopeless 0 0 0 0 0  PHQ - 2 Score 0 0 0 0  0   Polyuria: Pt gets up 2-3 times at night. Pt states she drinks lot of water and this started when she inc her water intake.  Fall Screening: No falls in the past year  Functional Status Screening: No positive responses  Vision:  Visual Acuity Screening   Right eye Left eye Both eyes  Without correction:     With correction: 20/40 20/50 20/50    Appears to be decreased compared to last test in April 2016. Pt has reading glasses from the drug store. Pt doesn't have an ophthalmologist, but would like to be referred. Pt is concerned about developing cataracts.  HLD: Takes Lipitor 20 mg QD. Pt  Lab Results  Component Value Date   CHOL 181 10/14/2014   HDL 58 10/14/2014   LDLCALC 84 10/14/2014   TRIG 193* 10/14/2014   CHOLHDL 3.1 10/14/2014   Lab Results  Component Value Date   ALT 15 10/14/2014   AST 17 10/14/2014   ALKPHOS 49 10/14/2014   BILITOT 0.8 10/14/2014   Allergic Rhinitis: Takes Flonase PRN- pt understands she should use it daily. Has used albuterol for previous bronchitis. Pt denies having any side affects from her medications.   Hypothyroid: Takes Synthroid 75 MCG QD. Pt denies any side affects from this medication. Lab Results  Component Value Date   TSH 1.792 10/14/2014    Exercise: Pt swims for 45 mins 3x a weekfor exercising. 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  Dental: Pt has regular appts every 6 months.  Advance Directives: Pt just made her living will about a mont ago and plans on dropping it off at the office.  HIV/Hep C Screening: Hep C antibody was negative Oct 2015. Pt would like to get her HIV testing. Pt denies having any new risk factors; no new partners.  SHx: Pt quit smoking 07/10/2015 because she got bronchitis in the beginning of the year. Pt states she has had trouble quiting smoking in the past. Pt is retiring in September. Pt is from [place]; she came in '71 for a marriage.   Patient Active Problem List   Diagnosis Date  Noted  . Psoriasis 11/05/2013  . Smoking 11/05/2013  . Lipid disorder 11/09/2011  . Hypothyroid 11/09/2011   Past Medical History  Diagnosis Date  . Allergy   . Thyroid disease    Past Surgical History  Procedure Laterality Date  . Abdominal hysterectomy     No Known Allergies Prior to Admission medications   Medication Sig Start Date End Date Taking? Authorizing Provider  Albuterol Sulfate (PROAIR RESPICLICK) 123XX123 (90 Base) MCG/ACT AEPB Inhale 2 puffs into the lungs every 6 (six) hours as needed. 08/22/15  Yes Robyn Haber, MD  atorvastatin (LIPITOR) 20 MG tablet Take 1 tablet (20 mg total) by mouth daily. 10/24/15  Yes Mancel Bale, PA-C  fluticasone (FLONASE) 50 MCG/ACT nasal spray Place 2 sprays into both nostrils daily. 07/10/15  Yes Leandrew Koyanagi, MD  levofloxacin (LEVAQUIN) 500 MG tablet Take 1 tablet (500 mg total) by mouth daily. 08/22/15  Yes Robyn Haber, MD  levothyroxine (SYNTHROID, LEVOTHROID) 75 MCG tablet Take 1 tablet (75 mcg total) by mouth daily. 10/24/15  Yes Mancel Bale, PA-C  Multiple Vitamin (MULTIVITAMIN) tablet Take 1 tablet by mouth daily.   Yes Historical Provider, MD  predniSONE (DELTASONE) 20 MG tablet 3/3/2/2/1/1 single daily dose for 6 days Patient not taking: Reported on 12/22/2015 08/22/15   Robyn Haber, MD   Social History   Social History  . Marital Status: Single    Spouse Name: N/A  . Number of Children: N/A  . Years of Education: N/A   Occupational History  . Not on file.   Social History Main Topics  . Smoking status: Former Smoker -- 1.00 packs/day for 50 years    Types: Cigarettes    Quit date: 07/03/2015  . Smokeless tobacco: Not on file  . Alcohol Use: 0.0 oz/week    0 Standard drinks or equivalent per week  . Drug Use: No  . Sexual Activity: No   Other Topics Concern  . Not on file   Social History Narrative    Review of Systems  HENT: Positive for postnasal drip and rhinorrhea.   Eyes: Positive for  discharge, itching and visual disturbance.  Cardiovascular: Positive for palpitations. Negative for chest pain.  Gastrointestinal: Negative for abdominal pain.  Endocrine: Positive for polyuria.  Genitourinary: Negative for dysuria.  Musculoskeletal: Positive for arthralgias (leg pain). Negative for myalgias.  Allergic/Immunologic: Positive for environmental allergies (seasonal).  Neurological: Negative for dizziness, syncope, weakness and light-headedness.    Objective:  BP 140/78 mmHg  Pulse 66  Temp(Src) 98.5 F (36.9 C) (Oral)  Resp 16  Ht 5' 4.5" (1.638 m)  Wt 204 lb 6.4 oz (92.715 kg)  BMI 34.56 kg/m2  SpO2 94%  Physical Exam  Constitutional: She is oriented to person, place, and time. She appears well-developed and well-nourished.  HENT:  Head: Normocephalic and atraumatic.  Right Ear: External ear normal.  Left Ear: External ear normal.  Mouth/Throat: Oropharynx is clear and moist.  Eyes: Conjunctivae are normal. Pupils are equal, round, and reactive to light.  Neck: Normal range of motion. Neck supple. No thyromegaly present.  Cardiovascular: Normal rate, regular rhythm, normal heart sounds and intact distal pulses.   No murmur heard. Pulmonary/Chest: Effort normal and breath sounds normal. No respiratory distress. She has no wheezes.  Abdominal: Soft. Bowel sounds are normal. There is no tenderness.  Musculoskeletal: Normal range of motion. She exhibits no edema or tenderness.  Left leg non tender Lumbar nontender Area of discomfort is hamstring.  Lymphadenopathy:    She has no cervical adenopathy.  Neurological: She is alert and oriented to person, place, and time.  Skin: Skin is warm and dry. No rash noted.  Psychiatric: She has a normal mood and affect. Her behavior is normal. Thought content normal.  Vitals reviewed.     Assessment & Plan:   Stephanie Carter is a 70 y.o. female Medicare annual wellness visit, subsequent  --anticipatory guidance as below  in AVS, screening labs above. Health maintenance items as above in HPI discussed/recommended as applicable.   Hypothyroidism, unspecified hypothyroidism type - Plan: TSH, levothyroxine (SYNTHROID, LEVOTHROID) 75 MCG tablet  -Tolerating Synthroid. Labs pending.  Hyperlipidemia - Plan: COMPLETE METABOLIC PANEL WITH GFR, Lipid panel, atorvastatin (LIPITOR) 20 MG tablet  -Tolerating Lipitor. Labs pending.  recurrent sinus and ear problems thought due to allergic rhinitis - Plan: fluticasone (FLONASE) 50 MCG/ACT nasal spray  -Overall stable. Allergen avoidance measures discussed. Continue Flonase same dose.  Screening for HIV (human immunodeficiency virus) - Plan: HIV antibody  Need for prophylactic vaccination against Streptococcus pneumoniae (pneumococcus) - Plan: Pneumococcal conjugate vaccine 13-valent IM  -Prevnar given  Hamstring tightness of left lower extremity  -Does not appear to be sciatica. Based on history and exam, suspect she has a tight hamstring. Hamstring stretches were discussed, RTC precautions if persists.  Meds ordered this encounter  Medications  . levothyroxine (SYNTHROID, LEVOTHROID) 75 MCG tablet    Sig: Take 1 tablet (75 mcg total) by mouth daily.    Dispense:  90 tablet    Refill:  3  . atorvastatin (LIPITOR) 20 MG tablet    Sig: Take 1 tablet (20 mg total) by mouth daily.    Dispense:  90 tablet    Refill:  3  . fluticasone (FLONASE) 50 MCG/ACT nasal spray    Sig: Place 2 sprays into both nostrils daily.    Dispense:  16 g    Refill:  6   Patient Instructions       IF you received an x-ray today, you will receive an invoice from [facility]. Please contact [facility] at [phone] with questions or concerns regarding your invoice.   IF you received labwork today, you will receive an invoice from [employer]. Please contact [facility] at [phone] with questions or concerns regarding your invoice.   Our  billing staff will not be able to assist you with questions regarding bills from these companies.  You will be contacted with the lab results as soon as they are available. The fastest way to get your results is to activate your My Chart account. Instructions are located on the last page of this paperwork. If you have not heard from [place] regarding the results in 2 weeks, please contact this office.    We recommend that you schedule a mammogram for breast cancer screening. Typically,  you do not need a referral to do this. Please contact a local imaging center to schedule your mammogram. [facility] - [phone]  Arrange appointment with eye care provider.  Let me know if resources needed to continue to keep from smoking.  Let me know if you would like to shingles vaccine. Prevnar (other pneumonia vaccine) was given today.  Try the hamstring stretches, especially after your exercises. If the pain in your leg continues, return for other testing or discussion.  Keeping You Healthy  Get These Tests  Blood Pressure- Have your blood pressure checked by your healthcare provider at least once a year.  Normal blood pressure is 120/80.  Weight- Have your body mass index (BMI) calculated to screen for obesity.  BMI is a measure of body fat based on height and weight.  You can calculate your own BMI at [employer].it  Cholesterol- Have your cholesterol checked every year.  Diabetes- Have your blood sugar checked every year if you have high blood pressure, high cholesterol, a family history of diabetes or if you are overweight.  Pap Test - Have a pap test every 1 to 5 years if you have been sexually active.  If you are older than 65 and recent pap tests have been normal you may not need additional pap tests.  In addition, if you have had a hysterectomy  for benign disease additional pap tests are not necessary.  Mammogram-Yearly mammograms are essential for early detection of  breast cancer  Screening for Colon Cancer- Colonoscopy starting at age 50. Screening may begin sooner depending on your family history and other health conditions.  Follow up colonoscopy as directed by your Gastroenterologist.  Screening for Osteoporosis- Screening begins at age 34 with bone density scanning, sooner if you are at higher risk for developing Osteoporosis.  Get these medicines  Calcium with Vitamin D- Your body requires 1200-1500 mg of Calcium a day and 3515331204 IU of Vitamin D a day.  You can only absorb 500 mg of Calcium at a time therefore Calcium must be taken in 2 or 3 separate doses throughout the day.  Hormones- Hormone therapy has been associated with increased risk for certain cancers and heart disease.  Talk to your healthcare provider about if you need relief from menopausal symptoms.  Aspirin- Ask your healthcare provider about taking Aspirin to prevent Heart Disease and Stroke.  Get these Immuniztions  Flu shot- Every fall  Pneumonia shot- Once after the age of 30; if you are younger ask your healthcare provider if you need a pneumonia shot.  Tetanus- Every ten years.  Zostavax- Once after the age of 60 to prevent shingles.  Take these steps  Don't smoke- Your healthcare provider can help you quit. For tips on how to quit, ask your healthcare provider or go to www.smokefree.gov or call 1-800 QUIT-NOW.  Be physically active- Exercise 5 days a week for a minimum of 30 minutes.  If you are not already physically active, start slow and gradually work up to 30 minutes of moderate physical activity.  Try walking, dancing, bike riding, swimming, etc.  Eat a healthy diet- Eat a variety of healthy foods such as fruits, vegetables, whole grains, low fat milk, low fat cheeses, yogurt, lean meats, chicken, fish, eggs, dried beans, tofu, etc.  For more information go to www.thenutritionsource.org  Dental visit- Brush and floss teeth twice daily; visit your dentist twice  a year.  Eye exam- Visit your Optometrist or Ophthalmologist yearly.  Drink  alcohol in moderation- Limit alcohol intake to one drink or less a day.  Never drink and drive.  Depression- Your emotional health is as important as your physical health.  If you're feeling down or losing interest in things you normally enjoy, please talk to your healthcare provider.  Seat Belts- can save your life; always wear one  Smoke/Carbon Monoxide detectors- These detectors need to be installed on the appropriate level of your home.  Replace batteries at least once a year.  Violence- If anyone is threatening or hurting you, please tell your healthcare provider.  Living Will/ Health care power of attorney- Discuss with your healthcare provider and family.     I personally performed the services described in this documentation, which was scribed in my presence. The recorded information has been reviewed and considered, and addended by me as needed.   Signed,   [name], MD [facility].  12/24/2015 9:47 AM

## 2015-12-22 NOTE — Patient Instructions (Addendum)
IF you received an x-ray today, you will receive an invoice from [facility]. Please contact [facility] at [phone] with questions or concerns regarding your invoice.   IF you received labwork today, you will receive an invoice from [employer]. Please contact [facility] at [phone] with questions or concerns regarding your invoice.   Our billing staff will not be able to assist you with questions regarding bills from these companies.  You will be contacted with the lab results as soon as they are available. The fastest way to get your results is to activate your My Chart account. Instructions are located on the last page of this paperwork. If you have not heard from [place] regarding the results in 2 weeks, please contact this office.    We recommend that you schedule a mammogram for breast cancer screening. Typically, you do not need a referral to do this. Please contact a local imaging center to schedule your mammogram. [facility] - [phone]  Arrange appointment with eye care provider.  Let me know if resources needed to continue to keep from smoking.  Let me know if you would like to shingles vaccine. Prevnar (other pneumonia vaccine) was given today.  Try the hamstring stretches, especially after your exercises. If the pain in your leg continues, return for other testing or discussion.  Keeping You Healthy  Get These Tests  Blood Pressure- Have your blood pressure checked by your healthcare provider at least once a year.  Normal blood pressure is 120/80.  Weight- Have your body mass index (BMI) calculated to screen for obesity.  BMI is a measure of body fat based on height and weight.  You can calculate your own BMI at [employer]  Cholesterol- Have your cholesterol checked every year.  Diabetes- Have your blood sugar checked every year if you have high blood pressure, high cholesterol, a family  history of diabetes or if you are overweight.  Pap Test - Have a pap test every 1 to 5 years if you have been sexually active.  If you are older than 65 and recent pap tests have been normal you may not need additional pap tests.  In addition, if you have had a hysterectomy  for benign disease additional pap tests are not necessary.  Mammogram-Yearly mammograms are essential for early detection of breast cancer  Screening for Colon Cancer- Colonoscopy starting at age 65. Screening may begin sooner depending on your family history and other health conditions.  Follow up colonoscopy as directed by your Gastroenterologist.  Screening for Osteoporosis- Screening begins at age 9 with bone density scanning, sooner if you are at higher risk for developing Osteoporosis.  Get these medicines  Calcium with Vitamin D- Your body requires 1200-1500 mg of Calcium a day and [phone] IU of Vitamin D a day.  You can only absorb 500 mg of Calcium at a time therefore Calcium must be taken in 2 or 3 separate doses throughout the day.  Hormones- Hormone therapy has been associated with increased risk for certain cancers and heart disease.  Talk to your healthcare provider about if you need relief from menopausal symptoms.  Aspirin- Ask your healthcare provider about taking Aspirin to prevent Heart Disease and Stroke.  Get these Immuniztions  Flu shot- Every fall  Pneumonia shot- Once after the age of 51; if you are younger ask your healthcare provider if you need a pneumonia shot.  Tetanus- Every ten years.  Zostavax- Once after the  age of 36 to prevent shingles.  Take these steps  Don't smoke- Your healthcare provider can help you quit. For tips on how to quit, ask your healthcare provider or go to www.smokefree.gov or call 1-800 QUIT-NOW.  Be physically active- Exercise 5 days a week for a minimum of 30 minutes.  If you are not already physically active, start slow and gradually work up to 30 minutes  of moderate physical activity.  Try walking, dancing, bike riding, swimming, etc.  Eat a healthy diet- Eat a variety of healthy foods such as fruits, vegetables, whole grains, low fat milk, low fat cheeses, yogurt, lean meats, chicken, fish, eggs, dried beans, tofu, etc.  For more information go to www.thenutritionsource.org  Dental visit- Brush and floss teeth twice daily; visit your dentist twice a year.  Eye exam- Visit your Optometrist or Ophthalmologist yearly.  Drink alcohol in moderation- Limit alcohol intake to one drink or less a day.  Never drink and drive.  Depression- Your emotional health is as important as your physical health.  If you're feeling down or losing interest in things you normally enjoy, please talk to your healthcare provider.  Seat Belts- can save your life; always wear one  Smoke/Carbon Monoxide detectors- These detectors need to be installed on the appropriate level of your home.  Replace batteries at least once a year.  Violence- If anyone is threatening or hurting you, please tell your healthcare provider.  Living Will/ Health care power of attorney- Discuss with your healthcare provider and family.

## 2015-12-22 NOTE — Progress Notes (Signed)
   Subjective:    Patient ID: Stephanie Carter, female    DOB: 24-Jan-1946, 70 y.o.   MRN: [id number]  HPI    Review of Systems  Constitutional: Negative.   HENT: Positive for postnasal drip and rhinorrhea.   Eyes: Positive for discharge, itching and visual disturbance.  Respiratory: Negative.   Cardiovascular: Negative.   Gastrointestinal: Negative.   Endocrine: Positive for polyuria.  Genitourinary: Negative.   Musculoskeletal: Negative.   Skin: Negative.   Allergic/Immunologic: Positive for environmental allergies.  Neurological: Negative.   Hematological: Negative.   Psychiatric/Behavioral: Negative.        Objective:   Physical Exam        Assessment & Plan:

## 2015-12-23 LAB — COMPLETE METABOLIC PANEL WITH GFR
ALBUMIN: 4.4 g/dL (ref 3.6–5.1)
ALK PHOS: 57 U/L (ref 33–130)
ALT: 21 U/L (ref 6–29)
AST: 22 U/L (ref 10–35)
BILIRUBIN TOTAL: 0.7 mg/dL (ref 0.2–1.2)
BUN: 16 mg/dL (ref 7–25)
CALCIUM: 9.5 mg/dL (ref 8.6–10.4)
CO2: 23 mmol/L (ref 20–31)
CREATININE: 0.75 mg/dL (ref 0.60–0.93)
Chloride: 105 mmol/L (ref 98–110)
GFR, Est Non African American: 80 mL/min (ref >=60)
Glucose, Bld: 103 mg/dL — ABNORMAL HIGH (ref 65–99)
Potassium: 4.1 mmol/L (ref 3.5–5.3)
Sodium: 137 mmol/L (ref 135–146)
TOTAL PROTEIN: 7.1 g/dL (ref 6.1–8.1)

## 2015-12-23 LAB — LIPID PANEL
CHOLESTEROL: 184 mg/dL (ref 125–200)
HDL: 67 mg/dL (ref >=46)
LDL CALC: 88 mg/dL (ref <130)
TRIGLYCERIDES: 143 mg/dL (ref <150)
Total CHOL/HDL Ratio: 2.7 Ratio (ref <=5.0)
VLDL: 29 mg/dL (ref <30)

## 2015-12-24 ENCOUNTER — Encounter: Payer: Self-pay | Admitting: Family Medicine

## 2016-01-03 ENCOUNTER — Telehealth: Payer: Self-pay | Admitting: Emergency Medicine

## 2016-01-03 NOTE — Telephone Encounter (Signed)
Left message with normal lab results.

## 2016-01-03 NOTE — Telephone Encounter (Signed)
-----   Message from Wendie Agreste, MD sent at 01/02/2016  8:26 AM EDT ----- Call patient.  Kidney tests, liver function tests, electrolytes, cholesterol,  and other labs tested at last visit were overall in normal range.  This includes normal thyroid test, and nonreactive or negative HIV test. Blood sugar was borderline elevated at 103, but can recheck this in 6 months. Let me know if there are any questions

## 2016-03-01 ENCOUNTER — Other Ambulatory Visit: Payer: Self-pay | Admitting: Family Medicine

## 2016-03-01 DIAGNOSIS — M8580 Other specified disorders of bone density and structure, unspecified site: Secondary | ICD-10-CM

## 2016-03-01 DIAGNOSIS — Z1231 Encounter for screening mammogram for malignant neoplasm of breast: Secondary | ICD-10-CM | POA: Diagnosis not present

## 2016-03-01 DIAGNOSIS — Z13820 Encounter for screening for osteoporosis: Secondary | ICD-10-CM

## 2016-03-01 DIAGNOSIS — E039 Hypothyroidism, unspecified: Secondary | ICD-10-CM

## 2016-03-01 LAB — HM MAMMOGRAPHY: HM MAMMO: NORMAL (ref 0–4)

## 2016-06-18 ENCOUNTER — Telehealth: Payer: Self-pay

## 2016-06-18 NOTE — Telephone Encounter (Signed)
THIS CALL IS FROM JAMIE AT [facility]: SHE NEEDS TO KNOW IF THIS PATIENT HAS EVER HAD THE T-DAP SHOT AND WHAT PNEUMONIA VACCINES SHE HAS HAD? THE PATIENT DID NOT KNOW. BEST PHONE [phone] (JAMIE AT [facility]) [place]

## 2016-06-25 NOTE — Telephone Encounter (Signed)
Spoke with Larkin Ina at [facility] and gave vaccines dates and type.

## 2016-07-13 DIAGNOSIS — R21 Rash and other nonspecific skin eruption: Secondary | ICD-10-CM | POA: Diagnosis not present

## 2016-07-13 DIAGNOSIS — L308 Other specified dermatitis: Secondary | ICD-10-CM | POA: Diagnosis not present

## 2017-02-12 ENCOUNTER — Encounter: Payer: Self-pay | Admitting: Family Medicine

## 2017-02-12 ENCOUNTER — Ambulatory Visit (INDEPENDENT_AMBULATORY_CARE_PROVIDER_SITE_OTHER): Payer: Medicare Other | Admitting: Family Medicine

## 2017-02-12 VITALS — BP 128/82 | HR 60 | Temp 97.7°F | Resp 17 | Ht 64.5 in | Wt 218.0 lb

## 2017-02-12 DIAGNOSIS — M7989 Other specified soft tissue disorders: Secondary | ICD-10-CM | POA: Diagnosis not present

## 2017-02-12 DIAGNOSIS — E785 Hyperlipidemia, unspecified: Secondary | ICD-10-CM

## 2017-02-12 DIAGNOSIS — Z0000 Encounter for general adult medical examination without abnormal findings: Secondary | ICD-10-CM

## 2017-02-12 DIAGNOSIS — Z23 Encounter for immunization: Secondary | ICD-10-CM | POA: Diagnosis not present

## 2017-02-12 DIAGNOSIS — E2839 Other primary ovarian failure: Secondary | ICD-10-CM | POA: Diagnosis not present

## 2017-02-12 DIAGNOSIS — M79662 Pain in left lower leg: Secondary | ICD-10-CM | POA: Diagnosis not present

## 2017-02-12 DIAGNOSIS — E039 Hypothyroidism, unspecified: Secondary | ICD-10-CM

## 2017-02-12 DIAGNOSIS — Z780 Asymptomatic menopausal state: Secondary | ICD-10-CM

## 2017-02-12 DIAGNOSIS — H65193 Other acute nonsuppurative otitis media, bilateral: Secondary | ICD-10-CM | POA: Diagnosis not present

## 2017-02-12 MED ORDER — LEVOTHYROXINE SODIUM 75 MCG PO TABS: 75.0000 ug | ORAL_TABLET | Freq: Every day | ORAL | 3 refills | Status: DC

## 2017-02-12 MED ORDER — ATORVASTATIN CALCIUM 20 MG PO TABS: 20.0000 mg | ORAL_TABLET | Freq: Every day | ORAL | 3 refills | Status: DC

## 2017-02-12 MED ORDER — FLUTICASONE PROPIONATE 50 MCG/ACT NA SUSP: 2.0000 | Freq: Every day | NASAL | 11 refills | Status: DC

## 2017-02-12 NOTE — Patient Instructions (Addendum)
We recommend that you schedule a mammogram for breast cancer screening. Typically, you do not need a referral to do this. Please contact a local imaging center to schedule your mammogram. [facility] - [phone]  I will schedule bone density test, but make sure you obtain 1200-1500mg  calcium and 800 units vitamin D per day.   Let me know if you would like the shingles vaccine sent in.   I will order an ultrasound of your calf, but if that is normal, return to discuss other causes or treatments.   Schedule appointment with eye care provider.   Keeping You Healthy  Get These Tests  Blood Pressure- Have your blood pressure checked by your healthcare provider at least once a year.  Normal blood pressure is 120/80.  Weight- Have your body mass index (BMI) calculated to screen for obesity.  BMI is a measure of body fat based on height and weight.  You can calculate your own BMI at [employer]  Cholesterol- Have your cholesterol checked every year.  Diabetes- Have your blood sugar checked every year if you have high blood pressure, high cholesterol, a family history of diabetes or if you are overweight.  Pap Test - Have a pap test every 1 to 5 years if you have been sexually active.  If you are older than 65 and recent pap tests have been normal you may not need additional pap tests.  In addition, if you have had a hysterectomy  for benign disease additional pap tests are not necessary.  Mammogram-Yearly mammograms are essential for early detection of breast cancer  Screening for Colon Cancer- Colonoscopy starting at age 52. Screening may begin sooner depending on your family history and other health conditions.  Follow up colonoscopy as directed by your Gastroenterologist.  Screening for Osteoporosis- Screening begins at age 25 with bone density scanning, sooner if you are at higher risk for developing Osteoporosis.  Get these medicines  Calcium with Vitamin  D- Your body requires 1200-1500 mg of Calcium a day and [phone] IU of Vitamin D a day.  You can only absorb 500 mg of Calcium at a time therefore Calcium must be taken in 2 or 3 separate doses throughout the day.  Hormones- Hormone therapy has been associated with increased risk for certain cancers and heart disease.  Talk to your healthcare provider about if you need relief from menopausal symptoms.  Aspirin- Ask your healthcare provider about taking Aspirin to prevent Heart Disease and Stroke.  Get these Immuniztions  Flu shot- Every fall  Pneumonia shot- Once after the age of 42; if you are younger ask your healthcare provider if you need a pneumonia shot.  Tetanus- Every ten years.  Zostavax- Once after the age of 46 to prevent shingles.  Take these steps  Don't smoke- Your healthcare provider can help you quit. For tips on how to quit, ask your healthcare provider or go to www.smokefree.gov or call 1-800 QUIT-NOW.  Be physically active- Exercise 5 days a week for a minimum of 30 minutes.  If you are not already physically active, start slow and gradually work up to 30 minutes of moderate physical activity.  Try walking, dancing, bike riding, swimming, etc.  Eat a healthy diet- Eat a variety of healthy foods such as fruits, vegetables, whole grains, low fat milk, low fat cheeses, yogurt, lean meats, chicken, fish, eggs, dried beans, tofu, etc.  For more information go to www.thenutritionsource.org  Dental visit- Brush and floss teeth twice daily;  visit your dentist twice a year.  Eye exam- Visit your Optometrist or Ophthalmologist yearly.  Drink alcohol in moderation- Limit alcohol intake to one drink or less a day.  Never drink and drive.  Depression- Your emotional health is as important as your physical health.  If you're feeling down or losing interest in things you normally enjoy, please talk to your healthcare provider.  Seat Belts- can save your life; always wear  one  Smoke/Carbon Monoxide detectors- These detectors need to be installed on the appropriate level of your home.  Replace batteries at least once a year.  Violence- If anyone is threatening or hurting you, please tell your healthcare provider.  Living Will/ Health care power of attorney- Discuss with your healthcare provider and family.  IF you received an x-ray today, you will receive an invoice from [facility]. Please contact [facility] at [phone] with questions or concerns regarding your invoice.   IF you received labwork today, you will receive an invoice from [place]. Please contact [facility] at [phone] with questions or concerns regarding your invoice.   Our billing staff will not be able to assist you with questions regarding bills from these companies.  You will be contacted with the lab results as soon as they are available. The fastest way to get your results is to activate your My Chart account. Instructions are located on the last page of this paperwork. If you have not heard from [place] regarding the results in 2 weeks, please contact this office.

## 2017-02-12 NOTE — Progress Notes (Signed)
Subjective:  By signing my name below, I, [name], attest that this documentation has been prepared under the direction and in the presence of [name], MD. Electronically Signed: [name], [place]. 02/12/2017 , 10:16 AM .  Patient was seen in Room 10 .   Patient ID: Stephanie Carter, female    DOB: 1946/05/05, 71 y.o.   MRN: [id number] Chief Complaint  Patient presents with  . Annual Exam   HPI Stephanie Carter is a 71 y.o. female Here for annual physical. She has history of hypothyroidism, hyperlipidemia, tobacco use, and allergic rhinitis. She was last seen in June 2017 for medicare wellness exam. She plans to travel to the [place] in Oct, and to [place] in Nov. She is fasting today.   Left knee swelling Patient complains of left knee swelling with intermittent pain in her left calf that's been ongoing for a few months. She notes having pain after walking a lot. She denies any known injury. She denies long distance travel in the past 6 months. She denies any known blood clots. She mentions history of playing squash and racquetball, as well as waiting tables for years.   Smoking She quit smoking about 4-5 months ago. She was a smoker of approximately 50 years.   Hypothyroidism  She takes synthroid 60mcg QD. She denies heat/cold intolerance.   Lab Results  Component Value Date   TSH 1.73 12/22/2015   Hyperglycemia Wt Readings from Last 3 Encounters:  02/12/17 218 lb (98.9 kg)  12/22/15 204 lb 6.4 oz (92.7 kg)  08/22/15 203 lb (92.1 kg)   She was borderline at 103 in June 2017.   Hyperlipidemia Lab Results  Component Value Date   CHOL 184 12/22/2015   HDL 67 12/22/2015   LDLCALC 88 12/22/2015   TRIG 143 12/22/2015   CHOLHDL 2.7 12/22/2015   Lab Results  Component Value Date   ALT 21 12/22/2015   AST 22 12/22/2015   ALKPHOS 57 12/22/2015   BILITOT 0.7 12/22/2015   She takes Lipitor 20mg  QD.   Allergic rhinitis She was prescribed Flonase last  year. She denies using her albuterol inhaler.   Cancer Screening Colonoscopy: last done in 2014 by Dr. Benson Norway, had polyp removed; repeat in 5 years.  Breast Cancer Screening: mammogram done in Aug 2014 with [facility]; no evidence of malignancy.  Hysterectomy: done at 71 years of age due to fibroids; no vaginal bleeding.  Bone density: states was done 5 years ago at [facility], which was normal. She takes multi-vitamin and eats plenty of yogurt.   Immunizations Immunization History  Administered Date(s) Administered  . Influenza Split 06/12/2012, 03/25/2013  . Influenza-Unspecified 06/10/2016  . Pneumococcal Conjugate-13 12/22/2015  . Pneumococcal-Unspecified 07/09/2009  . Tdap 07/09/2009   Shingles: She's never had chicken pox. She will double check with her insurance first.  Pneumonia: will update today.   Fall screening: No falls in past year.   Depression Depression screen [facility] 2/9 02/12/2017 12/22/2015 08/22/2015 07/10/2015 05/15/2015  Decreased Interest 0 0 0 0 0  Down, Depressed, Hopeless 0 0 0 0 0  PHQ - 2 Score 0 0 0 0 0    Vision  Visual Acuity Screening   Right eye Left eye Both eyes  Without correction:     With correction: 20/20 -1 20/25 20/25    She hasn't seen eye doctor recently; hasn't seen for years.   Dentist She is followed by dentist, due in Sept. She has all natural teeth.  Exercise She goes to the [facility] pool for 3 days a week.   Functional Status Survey: Is the patient deaf or have difficulty hearing?: No Does the patient have difficulty seeing, even when wearing glasses/contacts?: No Does the patient have difficulty concentrating, remembering, or making decisions?: No Does the patient have difficulty walking or climbing stairs?: No Does the patient have difficulty dressing or bathing?: No Does the patient have difficulty doing errands alone such as visiting a doctor's office or shopping?: No  Advanced Directives She has a living will power of attorney. She  will bring a copy by.    Patient Active Problem List   Diagnosis Date Noted  . Psoriasis 11/05/2013  . Smoking 11/05/2013  . Lipid disorder 11/09/2011  . Hypothyroid 11/09/2011   Past Medical History:  Diagnosis Date  . Allergy   . Thyroid disease    Past Surgical History:  Procedure Laterality Date  . ABDOMINAL HYSTERECTOMY     No Known Allergies Prior to Admission medications   Medication Sig Start Date End Date Taking? Authorizing Provider  Albuterol Sulfate (PROAIR RESPICLICK) 101 (90 Base) MCG/ACT AEPB Inhale 2 puffs into the lungs every 6 (six) hours as needed. 08/22/15  Yes Robyn Haber, MD  atorvastatin (LIPITOR) 20 MG tablet Take 1 tablet (20 mg total) by mouth daily. 12/22/15  Yes Wendie Agreste, MD  fluticasone (FLONASE) 50 MCG/ACT nasal spray Place 2 sprays into both nostrils daily. 12/22/15  Yes Wendie Agreste, MD  levothyroxine (SYNTHROID, LEVOTHROID) 75 MCG tablet Take 1 tablet (75 mcg total) by mouth daily. 12/22/15  Yes Wendie Agreste, MD  Multiple Vitamin (MULTIVITAMIN) tablet Take 1 tablet by mouth daily.   Yes [provider]   Social History   Social History  . Marital status: Single    Spouse name: N/A  . Number of children: N/A  . Years of education: N/A   Occupational History  . wait person    Social History Main Topics  . Smoking status: Former Smoker    Packs/day: 1.00    Years: 50.00    Types: Cigarettes    Quit date: 07/03/2015  . Smokeless tobacco: Never Used  . Alcohol use 0.0 oz/week  . Drug use: No  . Sexual activity: No   Other Topics Concern  . 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  Musculoskeletal: Positive for arthralgias.       Objective:   Physical Exam  Constitutional: She is oriented to person, place, and time. She appears well-developed and well-nourished.  HENT:  Head:  Normocephalic and atraumatic.  Right Ear: External ear normal.  Left Ear: External ear normal.  Mouth/Throat: Oropharynx is clear and moist.  Eyes: Pupils are equal, round, and reactive to light. Conjunctivae are normal.  Neck: Normal range of motion. Neck supple. No thyromegaly present.  Cardiovascular: Normal rate, regular rhythm, normal heart sounds and intact distal pulses.   No murmur heard. Pulmonary/Chest: Effort normal and breath sounds normal. No respiratory distress. She has no wheezes.  Abdominal: Soft. Bowel sounds are normal. There is no tenderness.  Musculoskeletal: Normal range of motion. She exhibits no edema or tenderness.  Left leg: left calf non tender; crepitance of left knee; full pain-free ROM, negative mcmurray, negative varus, negative valgus; no jointline tenderness, no effusion Calf circumference 15cm below patellar Left: 42cm  Right: 40cm  Lymphadenopathy:    She has no cervical adenopathy.  Neurological: She is alert and oriented to person, place, and time.  Skin: Skin is warm and dry. No rash noted.  Psychiatric: She has a normal mood and affect. Her behavior is normal. Thought content normal.  Vitals reviewed.   Vitals:   02/12/17 0901  BP: 128/82  Pulse: 60  Resp: 17  Temp: 97.7 F (36.5 C)  TempSrc: Oral  SpO2: 98%  Weight: 218 lb (98.9 kg)  Height: 5' 4.5" (1.638 m)      Assessment & Plan:   CAMIE HAUSS is a 71 y.o. female Medicare annual wellness visit, subsequent  -  - anticipatory guidance as below in AVS, screening labs if needed. Health maintenance items as above in HPI discussed/recommended as applicable.   - no concerning responses on depression, fall, or functional status screening. Any positive responses noted as above. Advanced directives discussed as in CHL.   Estrogen deficiency - Plan: DG Bone Density Postmenopausal - Plan: DG Bone Density  - Scheduled bone density  Hypothyroidism, unspecified type - Plan: TSH  -  Continue same dose of Synthroid  Pain of left calf - Plan: VAS [place] LOWER EXTREMITY VENOUS (DVT) Calf swelling - Plan: VAS [place] LOWER EXTREMITY VENOUS (DVT)  - Pain more with rain/humid environment. Intermittent pain. May be radiating pain from degenerative changes of knee, but with slight discrepancy in swelling, we'll initially start with ultrasound, then return for further evaluation  Hyperlipidemia, unspecified hyperlipidemia type - Plan: Comprehensive metabolic panel, Lipid panel, atorvastatin (LIPITOR) 20 MG tablet  - Tolerating Lipitor. Check labs. Refilled.  Need for prophylactic vaccination against Streptococcus pneumoniae (pneumococcus) - Plan: Pneumococcal polysaccharide vaccine 23-valent greater than or equal to 2yo subcutaneous/IM  - Appears prior Pneumovax was prior to age 25. Repeated today.  recurrent sinus and ear problems thought due to allergic rhinitis - Plan: fluticasone (FLONASE) 50 MCG/ACT nasal spray  -Continue Flonase nasal spray as needed.  Meds ordered this encounter  Medications  . atorvastatin (LIPITOR) 20 MG tablet    Sig: Take 1 tablet (20 mg total) by mouth daily.    Dispense:  90 tablet    Refill:  3  . fluticasone (FLONASE) 50 MCG/ACT nasal spray    Sig: Place 2 sprays into both nostrils daily.    Dispense:  16 g    Refill:  11  . levothyroxine (SYNTHROID, LEVOTHROID) 75 MCG tablet    Sig: Take 1 tablet (75 mcg total) by mouth daily.    Dispense:  90 tablet    Refill:  3   Patient Instructions   We recommend that you schedule a mammogram for breast cancer screening. Typically, you do not need a referral to do this. Please contact a local imaging center to schedule your mammogram. [facility] - [phone]  I will schedule bone density test, but make sure you obtain 1200-1500mg  calcium and 800 units vitamin D per day.   Let me know if you would like the shingles vaccine sent in.   I will order an ultrasound of your calf, but if that is  normal, return to discuss other causes or treatments.   Schedule appointment with eye care provider.   Keeping You Healthy  Get These Tests  Blood Pressure- Have your blood pressure checked by your healthcare provider at least once a year.  Normal blood pressure is 120/80.  Weight- Have your body mass index (BMI) calculated to screen for obesity.  BMI  is a measure of body fat based on height and weight.  You can calculate your own BMI at [employer].it  Cholesterol- Have your cholesterol checked every year.  Diabetes- Have your blood sugar checked every year if you have high blood pressure, high cholesterol, a family history of diabetes or if you are overweight.  Pap Test - Have a pap test every 1 to 5 years if you have been sexually active.  If you are older than 65 and recent pap tests have been normal you may not need additional pap tests.  In addition, if you have had a hysterectomy  for benign disease additional pap tests are not necessary.  Mammogram-Yearly mammograms are essential for early detection of breast cancer  Screening for Colon Cancer- Colonoscopy starting at age 75. Screening may begin sooner depending on your family history and other health conditions.  Follow up colonoscopy as directed by your Gastroenterologist.  Screening for Osteoporosis- Screening begins at age 70 with bone density scanning, sooner if you are at higher risk for developing Osteoporosis.  Get these medicines  Calcium with Vitamin D- Your body requires 1200-1500 mg of Calcium a day and [phone] IU of Vitamin D a day.  You can only absorb 500 mg of Calcium at a time therefore Calcium must be taken in 2 or 3 separate doses throughout the day.  Hormones- Hormone therapy has been associated with increased risk for certain cancers and heart disease.  Talk to your healthcare provider about if you need relief from menopausal symptoms.  Aspirin- Ask your healthcare provider about taking Aspirin  to prevent Heart Disease and Stroke.  Get these Immuniztions  Flu shot- Every fall  Pneumonia shot- Once after the age of 35; if you are younger ask your healthcare provider if you need a pneumonia shot.  Tetanus- Every ten years.  Zostavax- Once after the age of 10 to prevent shingles.  Take these steps  Don't smoke- Your healthcare provider can help you quit. For tips on how to quit, ask your healthcare provider or go to www.smokefree.gov or call 1-800 QUIT-NOW.  Be physically active- Exercise 5 days a week for a minimum of 30 minutes.  If you are not already physically active, start slow and gradually work up to 30 minutes of moderate physical activity.  Try walking, dancing, bike riding, swimming, etc.  Eat a healthy diet- Eat a variety of healthy foods such as fruits, vegetables, whole grains, low fat milk, low fat cheeses, yogurt, lean meats, chicken, fish, eggs, dried beans, tofu, etc.  For more information go to www.thenutritionsource.org  Dental visit- Brush and floss teeth twice daily; visit your dentist twice a year.  Eye exam- Visit your Optometrist or Ophthalmologist yearly.  Drink alcohol in moderation- Limit alcohol intake to one drink or less a day.  Never drink and drive.  Depression- Your emotional health is as important as your physical health.  If you're feeling down or losing interest in things you normally enjoy, please talk to your healthcare provider.  Seat Belts- can save your life; always wear one  Smoke/Carbon Monoxide detectors- These detectors need to be installed on the appropriate level of your home.  Replace batteries at least once a year.  Violence- If anyone is threatening or hurting you, please tell your healthcare provider.  Living Will/ Health care power of attorney- Discuss with your healthcare provider and family.  IF you received an x-ray today, you will receive an invoice from [facility]. Please contact [facility]  at  [phone] with questions or concerns regarding your invoice.   IF you received labwork today, you will receive an invoice from [place]. Please contact [facility] at [phone] with questions or concerns regarding your invoice.   Our billing staff will not be able to assist you with questions regarding bills from these companies.  You will be contacted with the lab results as soon as they are available. The fastest way to get your results is to activate your My Chart account. Instructions are located on the last page of this paperwork. If you have not heard from [place] regarding the results in 2 weeks, please contact this office.       I personally performed the services described in this documentation, which was scribed in my presence. The recorded information has been reviewed and considered for accuracy and completeness, addended by me as needed, and agree with information above.  Signed,   [name], MD [facility] [place].  02/12/17 1:26 PM

## 2017-02-13 LAB — LIPID PANEL
Chol/HDL Ratio: 3.6 ratio (ref 0.0–4.4)
Cholesterol, Total: 199 mg/dL (ref 100–199)
HDL: 55 mg/dL (ref >39)
LDL Calculated: 102 mg/dL — ABNORMAL HIGH (ref 0–99)
Triglycerides: 212 mg/dL — ABNORMAL HIGH (ref 0–149)
VLDL CHOLESTEROL CAL: 42 mg/dL — AB (ref 5–40)

## 2017-02-13 LAB — COMPREHENSIVE METABOLIC PANEL
ALK PHOS: 68 IU/L (ref 39–117)
ALT: 13 IU/L (ref 0–32)
AST: 17 IU/L (ref 0–40)
Albumin/Globulin Ratio: 1.8 (ref 1.2–2.2)
Albumin: 4.7 g/dL (ref 3.5–4.8)
BILIRUBIN TOTAL: 0.5 mg/dL (ref 0.0–1.2)
BUN / CREAT RATIO: 23 (ref 12–28)
BUN: 18 mg/dL (ref 8–27)
CHLORIDE: 101 mmol/L (ref 96–106)
CO2: 26 mmol/L (ref 20–29)
Calcium: 9.6 mg/dL (ref 8.7–10.3)
Creatinine, Ser: 0.79 mg/dL (ref 0.57–1.00)
GFR calc non Af Amer: 75 mL/min/{1.73_m2} (ref >59)
GFR, EST AFRICAN AMERICAN: 86 mL/min/{1.73_m2} (ref >59)
Globulin, Total: 2.6 g/dL (ref 1.5–4.5)
Glucose: 104 mg/dL — ABNORMAL HIGH (ref 65–99)
POTASSIUM: 4.4 mmol/L (ref 3.5–5.2)
Sodium: 143 mmol/L (ref 134–144)
Total Protein: 7.3 g/dL (ref 6.0–8.5)

## 2017-02-13 LAB — TSH: TSH: 1.79 u[IU]/mL (ref 0.450–4.500)

## 2017-02-19 ENCOUNTER — Ambulatory Visit (HOSPITAL_COMMUNITY)
Admission: RE | Admit: 2017-02-19 | Discharge: 2017-02-19 | Disposition: A | Discharge status: Home / Self Care | Payer: Medicare Other | Source: Ambulatory Visit | Attending: Family Medicine | Admitting: Family Medicine

## 2017-02-19 DIAGNOSIS — M7989 Other specified soft tissue disorders: Secondary | ICD-10-CM | POA: Insufficient documentation

## 2017-02-19 DIAGNOSIS — M79662 Pain in left lower leg: Secondary | ICD-10-CM | POA: Diagnosis not present

## 2017-02-19 NOTE — Progress Notes (Signed)
*  PRELIMINARY RESULTS* Vascular Ultrasound Left lower extremity venous duplex has been completed.  Preliminary findings: No evidence of deep vein thrombosis or baker's cysts in the left lower extremity.  Preliminary results called to Dr. Vonna Kotyk office, given to [place] @ 10:30.   Everrett Coombe 02/19/2017, 10:34 AM

## 2017-02-28 ENCOUNTER — Encounter: Payer: Self-pay | Admitting: Family Medicine

## 2017-02-28 ENCOUNTER — Ambulatory Visit (INDEPENDENT_AMBULATORY_CARE_PROVIDER_SITE_OTHER): Payer: Medicare Other | Admitting: Family Medicine

## 2017-02-28 VITALS — BP 111/71 | HR 62 | Temp 98.5°F | Resp 16 | Ht 62.0 in | Wt 218.0 lb

## 2017-02-28 DIAGNOSIS — M25562 Pain in left knee: Secondary | ICD-10-CM

## 2017-02-28 DIAGNOSIS — M79662 Pain in left lower leg: Secondary | ICD-10-CM

## 2017-02-28 DIAGNOSIS — M25572 Pain in left ankle and joints of left foot: Secondary | ICD-10-CM | POA: Diagnosis not present

## 2017-02-28 MED ORDER — MELOXICAM 7.5 MG PO TABS: 7.5000 mg | ORAL_TABLET | Freq: Every day | ORAL | 0 refills | Status: DC

## 2017-02-28 NOTE — Patient Instructions (Addendum)
  Try meloxicam 7.5 mg once per day, avoid Advil or Aleve while you're taking that medication. Gentle range of motion stretches are okay, avoid running, jumping or other quick movement activities that may make your calf or knee pain worse. If you are not improving within the next week to 10 days, can return sooner, or follow-up with me in 2 weeks to decide if other imaging is needed.    IF you received an x-ray today, you will receive an invoice from [facility]. Please contact [facility] at [phone] with questions or concerns regarding your invoice.   IF you received labwork today, you will receive an invoice from [place]. Please contact [facility] at [phone] with questions or concerns regarding your invoice.   Our billing staff will not be able to assist you with questions regarding bills from these companies.  You will be contacted with the lab results as soon as they are available. The fastest way to get your results is to activate your My Chart account. Instructions are located on the last page of this paperwork. If you have not heard from [place] regarding the results in 2 weeks, please contact this office.

## 2017-02-28 NOTE — Progress Notes (Signed)
Subjective:  By signing my name below, I, [name], attest that this documentation has been prepared under the direction and in the presence of [name], MD. Electronically Signed: [name], [place]. 02/28/2017 , 4:49 PM .  Patient was seen in Room 25 .   Patient ID: Stephanie Carter, female    DOB: 1945/10/06, 71 y.o.   MRN: [id number] Chief Complaint  Patient presents with  . Follow-up    left leg pain   HPI Stephanie Carter is a 71 y.o. female Here for follow up of left leg/calf pain. She was seen for medicare wellness visit on Aug 7th. She noted at that time, to have left knee swelling and intermittent pain in her left calf for a few months, and pain after walking extended times; no known injuries. Her ultrasound (done on Aug 14th) was negative for DVT.   She reports left knee and calf hurting a lot more 2 days ago, but has been ongoing for about 6 weeks now. She denies any known injuries to the area. She noticed episodic ankle pain too, but believes weather related. She took 2 motrin 2 days ago for her calf pain. She also notes her left calf feeling hot.   She believes she has sciatic pain located in her left buttock area. She feels it more when she turns over at night or waking up for nocturia. She denies low back pain, incontinence or saddle anesthesia. She mentions doing jumping pool exercises every Monday, Wednesday, and Friday. She did some this Monday (3 days ago), but did not do any yesterday. She denies history of abdominal ulcers.   She plans to leave for 3 weeks on Sept 12th to [place] region for vacation.   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   Patient Active Problem List   Diagnosis Date Noted  . Psoriasis 11/05/2013  . Smoking 11/05/2013  . Lipid disorder 11/09/2011  . Hypothyroid 11/09/2011   Past Medical History:  Diagnosis Date  . Allergy   . Thyroid disease    Past Surgical History:    Procedure Laterality Date  . ABDOMINAL HYSTERECTOMY     No Known Allergies Prior to Admission medications   Medication Sig Start Date End Date Taking? Authorizing Provider  Albuterol Sulfate (PROAIR RESPICLICK) 272 (90 Base) MCG/ACT AEPB Inhale 2 puffs into the lungs every 6 (six) hours as needed. 08/22/15   Robyn Haber, MD  atorvastatin (LIPITOR) 20 MG tablet Take 1 tablet (20 mg total) by mouth daily. 02/12/17   Wendie Agreste, MD  fluticasone (FLONASE) 50 MCG/ACT nasal spray Place 2 sprays into both nostrils daily. 02/12/17   Wendie Agreste, MD  levothyroxine (SYNTHROID, LEVOTHROID) 75 MCG tablet Take 1 tablet (75 mcg total) by mouth daily. 02/12/17   Wendie Agreste, MD  Multiple Vitamin (MULTIVITAMIN) tablet Take 1 tablet by mouth daily.    [provider]   Social History   Social History  . Marital status: Single    Spouse name: N/A  . Number of children: N/A  . Years of education: N/A   Occupational History  . wait person    Social History Main Topics  . Smoking status: Former Smoker    Packs/day: 1.00    Years: 50.00    Types: Cigarettes    Quit date: 07/03/2015  . Smokeless tobacco: Never Used  . Alcohol use 0.0 oz/week  . Drug use: No  . Sexual activity:  No   Other Topics Concern  . 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  Respiratory: Negative for cough.   Gastrointestinal: Negative for constipation, diarrhea, nausea and vomiting.  Musculoskeletal: Positive for arthralgias, joint swelling and myalgias. Negative for back pain and gait problem.  Skin: Negative for rash and wound.  Neurological: Negative for dizziness, weakness and headaches.       Objective:   Physical Exam  Constitutional: She is oriented to person, place, and time. She appears well-developed and well-nourished. No  distress.  HENT:  Head: Normocephalic and atraumatic.  Eyes: Pupils are equal, round, and reactive to light. EOM are normal.  Neck: Neck supple.  Cardiovascular: Normal rate.   Pulses:      Dorsalis pedis pulses are 2+ on the right side, and 2+ on the left side.  Pulmonary/Chest: Effort normal. No respiratory distress.  Musculoskeletal: Normal range of motion.  Left knee: slight discomfort at the inferior patellar, patellar tendon non tender, some crepitance in left knee but full ROM, some slight effusion in her left knee, negative mcmurray's, calf non tender; ankle full ROM Left hip: pain free internal and external rotation, tightness and pulling sensation in her hamstring with straight leg raise on left; slight radiation of pain down the left leg with left rotation of the spine  Neurological: She is alert and oriented to person, place, and time.  Skin: Skin is warm and dry.  Psychiatric: She has a normal mood and affect. Her behavior is normal.  Nursing note and vitals reviewed.   Vitals:   02/28/17 1605  BP: 111/71  Pulse: 62  Resp: 16  Temp: 98.5 F (36.9 C)  TempSrc: Oral  SpO2: 96%  Weight: 218 lb (98.9 kg)  Height: 5\' 2"  (1.575 m)      Assessment & Plan:   Stephanie Carter is a 71 y.o. female Left knee pain, unspecified chronicity - Plan: meloxicam (MOBIC) 7.5 MG tablet  Pain of left calf - Plan: meloxicam (MOBIC) 7.5 MG tablet  Left ankle pain, unspecified chronicity - Plan: meloxicam (MOBIC) 7.5 MG tablet  Left knee, ankle/calf pain, but also does report some possible radicular pain from left low back and hip/thigh. Differential includes sciatica/radiculopathy from back. Previous ultrasound negative for DVT. Could also have some component of degenerative joint disease of the knee. Imaging was deferred today as without acute injury. NVI distally.  -start with Mobic 7.5 mg daily when necessary, then recheck in the next 10-14 days to see if specific imaging needed. RTC  precautions if worsening sooner  Meds ordered this encounter  Medications  . meloxicam (MOBIC) 7.5 MG tablet    Sig: Take 1 tablet (7.5 mg total) by mouth daily.    Dispense:  30 tablet    Refill:  0   Patient Instructions    Try meloxicam 7.5 mg once per day, avoid Advil or Aleve while you're taking that medication. Gentle range of motion stretches are okay, avoid running, jumping or other quick movement activities that may make your calf or knee pain worse. If you are not improving within the next week to 10 days, can return sooner, or follow-up with me in 2 weeks to decide if other imaging is needed.    IF you received an x-ray today, you will receive an invoice from [facility]. Please contact  [facility] at [phone] with questions or concerns regarding your invoice.   IF you received labwork today, you will receive an invoice from [place]. Please contact [facility] at [phone] with questions or concerns regarding your invoice.   Our billing staff will not be able to assist you with questions regarding bills from these companies.  You will be contacted with the lab results as soon as they are available. The fastest way to get your results is to activate your My Chart account. Instructions are located on the last page of this paperwork. If you have not heard from [place] regarding the results in 2 weeks, please contact this office.       I personally performed the services described in this documentation, which was scribed in my presence. The recorded information has been reviewed and considered for accuracy and completeness, addended by me as needed, and agree with information above.  Signed,   [name], MD [facility] [place].  03/01/17 11:32 PM

## 2017-03-14 ENCOUNTER — Ambulatory Visit (INDEPENDENT_AMBULATORY_CARE_PROVIDER_SITE_OTHER): Payer: Medicare Other | Admitting: Family Medicine

## 2017-03-14 ENCOUNTER — Ambulatory Visit (INDEPENDENT_AMBULATORY_CARE_PROVIDER_SITE_OTHER): Payer: Medicare Other

## 2017-03-14 ENCOUNTER — Encounter: Payer: Self-pay | Admitting: Family Medicine

## 2017-03-14 VITALS — BP 118/77 | HR 58 | Temp 98.4°F | Resp 16 | Ht 62.0 in | Wt 216.0 lb

## 2017-03-14 DIAGNOSIS — M79662 Pain in left lower leg: Secondary | ICD-10-CM

## 2017-03-14 DIAGNOSIS — M25562 Pain in left knee: Secondary | ICD-10-CM | POA: Diagnosis not present

## 2017-03-14 DIAGNOSIS — M79605 Pain in left leg: Secondary | ICD-10-CM

## 2017-03-14 DIAGNOSIS — M25572 Pain in left ankle and joints of left foot: Secondary | ICD-10-CM

## 2017-03-14 MED ORDER — MELOXICAM 7.5 MG PO TABS: 7.5000 mg | ORAL_TABLET | Freq: Every day | ORAL | 0 refills | Status: DC

## 2017-03-14 NOTE — Patient Instructions (Addendum)
   Leg pain may be a combination of tight/sore hamstrings, and possible arthritis of your left knee. Ankle exam is reassuring today. Continue meloxicam 1 per day, tylenol if needed. Hamstring stretches and leg range of motion as we discussed. If not significantly improving the next few weeks, I would like to refer you to orthopedics. If it is worsening while you're traveling, please be seen locally.  Return to the clinic or go to the nearest emergency room if any of your symptoms worsen or new symptoms occur.   IF you received an x-ray today, you will receive an invoice from [facility]. Please contact [facility] at [phone] with questions or concerns regarding your invoice.   IF you received labwork today, you will receive an invoice from [facility]. Please contact [facility] at [phone] with questions or concerns regarding your invoice.   Our billing staff will not be able to assist you with questions regarding bills from these companies.  You will be contacted with the lab results as soon as they are available. The fastest way to get your results is to activate your My Chart account. Instructions are located on the last page of this paperwork. If you have not heard from [place] regarding the results in 2 weeks, please contact this office.

## 2017-03-14 NOTE — Progress Notes (Signed)
Subjective:  By signing my name below, I, [name], attest that this documentation has been prepared under the direction and in the presence of [name], MD. Electronically Signed: [name], [place]. 03/14/2017 , 5:18 PM .  Patient was seen in Room 12 .   Patient ID: Stephanie Carter, female    DOB: 1945-09-18, 70 y.o.   MRN: [id number] Chief Complaint  Patient presents with  . Follow-up    left leg pain   HPI Stephanie Carter is a 71 y.o. female  Patient was seen on Aug 23rd with left knee pain along with left calf pain. She had intermittent pain in the left calf and knee when discussed at her Medicare Wellness visit on Aug 7th; suspected sciatica component. She had negative ultrasound, also possible DJD of the knee. She was started on mobic 7.5mg  on Aug 23rd visit. Here for follow up.   Patient reports improvement with days where her left leg has been feeling great. On other days, including yesterday, the pain was present with noticed puffiness right below her left knee with some warmth and soreness over the area. She notes pain in the back of her left buttock, when she's in bed; but when she's moving, she feels fine. She's also been sleeping over at her daughter's place, waking up with pain. She also endorses aches in her ankles; notes breaking her left ankle in the past. She denies seeing orthopedist in the past.   She's flying out to the [facility] region next Wednesday (6 days) for a few weeks for vacation.   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   Patient Active Problem List   Diagnosis Date Noted  . Psoriasis 11/05/2013  . Smoking 11/05/2013  . Lipid disorder 11/09/2011  . Hypothyroid 11/09/2011   Past Medical History:  Diagnosis Date  . Allergy   . Thyroid disease    Past Surgical History:  Procedure Laterality Date  . ABDOMINAL HYSTERECTOMY     No Known Allergies Prior to Admission medications     Medication Sig Start Date End Date Taking? Authorizing Provider  atorvastatin (LIPITOR) 20 MG tablet Take 1 tablet (20 mg total) by mouth daily. 02/12/17  Yes Wendie Agreste, MD  fluticasone (FLONASE) 50 MCG/ACT nasal spray Place 2 sprays into both nostrils daily. 02/12/17  Yes Wendie Agreste, MD  levothyroxine (SYNTHROID, LEVOTHROID) 75 MCG tablet Take 1 tablet (75 mcg total) by mouth daily. 02/12/17  Yes Wendie Agreste, MD  meloxicam (MOBIC) 7.5 MG tablet Take 1 tablet (7.5 mg total) by mouth daily. 02/28/17  Yes Wendie Agreste, MD  Multiple Vitamin (MULTIVITAMIN) tablet Take 1 tablet by mouth daily.   Yes [provider]   Social History   Social History  . Marital status: Single    Spouse name: N/A  . Number of children: N/A  . Years of education: N/A   Occupational History  . wait person    Social History Main Topics  . Smoking status: Former Smoker    Packs/day: 1.00    Years: 50.00    Types: Cigarettes    Quit date: 07/03/2015  . Smokeless tobacco: Never Used  . Alcohol use 0.0 oz/week  . Drug use: No  . Sexual activity: No   Other Topics Concern  . 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  Respiratory: Negative for cough.   Gastrointestinal: Negative for constipation, diarrhea, nausea and vomiting.  Musculoskeletal: Positive for arthralgias, joint swelling and myalgias. Negative for back pain and gait problem.  Skin: Negative for rash and wound.  Neurological: Negative for dizziness, weakness and headaches.       Objective:   Physical Exam  Constitutional: She is oriented to person, place, and time. She appears well-developed and well-nourished. No distress.  HENT:  Head: Normocephalic and atraumatic.  Eyes: Pupils are equal, round, and reactive to light. EOM are normal.  Neck: Neck supple.   Cardiovascular: Normal rate.   Pulmonary/Chest: Effort normal. No respiratory distress.  Musculoskeletal: Normal range of motion.  Left knee: some tenderness along medial joint line, skin intact, fullness on the medial upper anterior lower leg on the left just below the joint line, possible trace effusion in left knee; some crepitance of the left knee, negative mcmurray Left hip: no rash, sciatic notch non tender; some discomfort in her hamstrings; negative seated straight leg raise; pain free internal and external rotation of left hip; complains discomfort of proximal hamstring L-spine: non tender, ROM flexion intact, does not recreate symptoms Left ankle: pain free ROM  Neurological: She is alert and oriented to person, place, and time.  Skin: Skin is warm and dry.  Psychiatric: She has a normal mood and affect. Her behavior is normal.  Nursing note and vitals reviewed.   Vitals:   03/14/17 1600  BP: 118/77  Pulse: (!) 58  Resp: 16  Temp: 98.4 F (36.9 C)  TempSrc: Oral  SpO2: 95%  Weight: 216 lb (98 kg)  Height: 5\' 2"  (1.575 m)   Dg Knee Complete 4 Views Left  Result Date: 03/14/2017 CLINICAL DATA:  Intermittent LEFT knee pain, no known injury, fullness at inferior medial knee, LEFT calf pain EXAM: LEFT KNEE - COMPLETE 4+ VIEW COMPARISON:  None FINDINGS: Diffuse osseous demineralization. Joint spaces fairly well preserved. No acute fracture, dislocation, or bone destruction. No knee joint effusion. IMPRESSION: Osseous demineralization without acute bony abnormalities. Electronically Signed   By: Lavonia Dana M.D.   On: 03/14/2017 17:44       Assessment & Plan:   Stephanie Carter is a 71 y.o. female Left knee pain, unspecified chronicity - Plan: meloxicam (MOBIC) 7.5 MG tablet, DG Knee Complete 4 Views Left  Pain of left calf - Plan: meloxicam (MOBIC) 7.5 MG tablet, DG Knee Complete 4 Views Left  Left ankle pain, unspecified chronicity - Plan: meloxicam (MOBIC) 7.5 MG  tablet  Left leg pain  Possible multifactorial with tight hamstring, possible degenerative disease knee, as well as intermittent sciatic type symptoms. Overall has improved with meloxicam. X-ray of knee without sign of significant disease, just some demineralization.  -Hamstring stretches discussed, continue meloxicam 7.5 mg daily, Tylenol if needed  -If not significant improvement in the next few weeks, refer to orthopedics. If worsening while traveling, be seen locally.  Meds ordered this encounter  Medications  . meloxicam (MOBIC) 7.5 MG tablet    Sig: Take 1 tablet (7.5 mg total) by mouth daily.    Dispense:  30 tablet    Refill:  0   Patient Instructions     Leg pain may be a combination of tight/sore hamstrings, and possible arthritis of your left knee. Ankle exam is reassuring today. Continue meloxicam 1 per day, tylenol if needed. Hamstring stretches and leg range of motion as we discussed.  If not significantly improving the next few weeks, I would like to refer you to orthopedics. If it is worsening while you're traveling, please be seen locally.  Return to the clinic or go to the nearest emergency room if any of your symptoms worsen or new symptoms occur.   IF you received an x-ray today, you will receive an invoice from [facility]. Please contact [facility] at [phone] with questions or concerns regarding your invoice.   IF you received labwork today, you will receive an invoice from [facility]. Please contact [facility] at [phone] with questions or concerns regarding your invoice.   Our billing staff will not be able to assist you with questions regarding bills from these companies.  You will be contacted with the lab results as soon as they are available. The fastest way to get your results is to activate your My Chart account. Instructions are located on the last page of this paperwork. If you have not heard from [place] regarding the results in 2  weeks, please contact this office.       I personally performed the services described in this documentation, which was scribed in my presence. The recorded information has been reviewed and considered for accuracy and completeness, addended by me as needed, and agree with information above.  Signed,   [name], MD [facility].  03/17/17 1:50 PM

## 2017-07-18 ENCOUNTER — Telehealth: Payer: Self-pay | Admitting: Family Medicine

## 2017-07-18 NOTE — Telephone Encounter (Signed)
Copied from [place] [id number]. Topic: General - Other >> Jul 18, 2017  5:24 PM Corie Chiquito, [place] wrote: Reason for CRM: Patient called and stated that she still has not gotten nor heard anything back about her bone density test that she had done. If someone could give her a call back about this at [phone]

## 2017-07-23 NOTE — Telephone Encounter (Signed)
Do you know where this was done?

## 2017-07-23 NOTE — Telephone Encounter (Signed)
Order was sent to [facility].

## 2017-12-09 DIAGNOSIS — E782 Mixed hyperlipidemia: Secondary | ICD-10-CM | POA: Diagnosis not present

## 2017-12-09 DIAGNOSIS — E039 Hypothyroidism, unspecified: Secondary | ICD-10-CM | POA: Diagnosis not present

## 2017-12-09 DIAGNOSIS — Z86010 Personal history of colonic polyps: Secondary | ICD-10-CM | POA: Diagnosis not present

## 2017-12-31 DIAGNOSIS — Z86010 Personal history of colonic polyps: Secondary | ICD-10-CM | POA: Diagnosis not present

## 2017-12-31 DIAGNOSIS — D122 Benign neoplasm of ascending colon: Secondary | ICD-10-CM | POA: Diagnosis not present

## 2017-12-31 DIAGNOSIS — D128 Benign neoplasm of rectum: Secondary | ICD-10-CM | POA: Diagnosis not present

## 2017-12-31 DIAGNOSIS — K635 Polyp of colon: Secondary | ICD-10-CM | POA: Diagnosis not present

## 2017-12-31 DIAGNOSIS — K5730 Diverticulosis of large intestine without perforation or abscess without bleeding: Secondary | ICD-10-CM | POA: Diagnosis not present

## 2017-12-31 LAB — HM COLONOSCOPY

## 2018-01-22 ENCOUNTER — Encounter: Payer: Self-pay | Admitting: *Deleted

## 2018-03-12 ENCOUNTER — Other Ambulatory Visit: Payer: Self-pay | Admitting: Family Medicine

## 2018-03-12 NOTE — Telephone Encounter (Signed)
Levothyroxine 0.075 mg refill Last Refill:12/11/17 # 90 Last OV: 03/14/17 lab: 02/12/17 PCP: Stephanie Carter Pharmacy:[facility] [address]  Patient has appointment: 05/01/18 (not filled due to protocol- max of 12 months- sent for provider review)

## 2018-03-13 ENCOUNTER — Other Ambulatory Visit: Payer: Self-pay | Admitting: Family Medicine

## 2018-03-13 ENCOUNTER — Other Ambulatory Visit: Payer: Self-pay

## 2018-03-13 MED ORDER — LEVOTHYROXINE SODIUM 75 MCG PO TABS
75.0000 ug | ORAL_TABLET | Freq: Every day | ORAL | 0 refills | Status: DC
Start: 2018-03-13 — End: 2018-05-01

## 2018-03-13 NOTE — Telephone Encounter (Signed)
Please review- can not see were Rx has been refilled on med list

## 2018-05-01 ENCOUNTER — Other Ambulatory Visit: Payer: Self-pay

## 2018-05-01 ENCOUNTER — Encounter: Payer: Self-pay | Admitting: Family Medicine

## 2018-05-01 ENCOUNTER — Ambulatory Visit (INDEPENDENT_AMBULATORY_CARE_PROVIDER_SITE_OTHER): Payer: Medicare Other | Admitting: Family Medicine

## 2018-05-01 VITALS — BP 135/73 | HR 60 | Temp 98.0°F | Ht 64.0 in | Wt 202.0 lb

## 2018-05-01 DIAGNOSIS — Z23 Encounter for immunization: Secondary | ICD-10-CM

## 2018-05-01 DIAGNOSIS — Z0000 Encounter for general adult medical examination without abnormal findings: Secondary | ICD-10-CM | POA: Diagnosis not present

## 2018-05-01 DIAGNOSIS — E039 Hypothyroidism, unspecified: Secondary | ICD-10-CM | POA: Diagnosis not present

## 2018-05-01 DIAGNOSIS — E785 Hyperlipidemia, unspecified: Secondary | ICD-10-CM

## 2018-05-01 DIAGNOSIS — H65193 Other acute nonsuppurative otitis media, bilateral: Secondary | ICD-10-CM

## 2018-05-01 MED ORDER — ZOSTER VAC RECOMB ADJUVANTED 50 MCG/0.5ML IM SUSR: 0.5000 mL | Freq: Once | INTRAMUSCULAR | 1 refills | Status: AC

## 2018-05-01 MED ORDER — LEVOTHYROXINE SODIUM 75 MCG PO TABS: 75.0000 ug | ORAL_TABLET | Freq: Every day | ORAL | 3 refills | Status: DC

## 2018-05-01 MED ORDER — FLUTICASONE PROPIONATE 50 MCG/ACT NA SUSP: 2.0000 | Freq: Every day | NASAL | 11 refills | Status: DC

## 2018-05-01 MED ORDER — ATORVASTATIN CALCIUM 20 MG PO TABS: 20.0000 mg | ORAL_TABLET | Freq: Every day | ORAL | 3 refills | Status: DC

## 2018-05-01 NOTE — Progress Notes (Signed)
Subjective:  By signing my name below, I, Essence Howell, attest that this documentation has been prepared under the direction and in the presence of Wendie Agreste, MD Electronically Signed: Ladene Artist, ED Scribe 05/01/2018 at 9:36 AM.   Patient ID: Stephanie Carter, female    DOB: 26-Apr-1946, 72 y.o.   MRN: [id number]  Chief Complaint  Patient presents with  . Annual Exam    CPE   HPI Stephanie Carter is a 72 y.o. female who presents to Primary Care at [facility] for an annual exam and f/u of chronic conditions. Pt is fasting at this time.  Hypothyroidism Synthroid 75 mcg qd. - Denies changes in skin, hair, weight, heat/cold intolerance. Lab Results  Component Value Date   TSH 1.790 02/12/2017   Hyperlipidemia Lab Results  Component Value Date   CHOL 199 02/12/2017   HDL 55 02/12/2017   LDLCALC 102 (H) 02/12/2017   TRIG 212 (H) 02/12/2017   CHOLHDL 3.6 02/12/2017   Lab Results  Component Value Date   ALT 13 02/12/2017   AST 17 02/12/2017   ALKPHOS 68 02/12/2017   BILITOT 0.5 02/12/2017  Lipitor 20 mg qd. - Pt states that she has been swimming and walking for exercise, also eating healthier. Denies myalgias, arthralgias, new side-effects.  Wt Readings from Last 3 Encounters:  05/01/18 202 lb (91.6 kg)  03/14/17 216 lb (98 kg)  02/28/17 218 lb (98.9 kg)   Allergic Rhinitis Flonase nasal spray prn.  CA Screening Colonoscopy: 12/31/17, rpt 3-5 yrs, multiple polyps removed Breast CA Screening: mammogram at [facility] on 03/01/16 Bone Density Screening: Dexa on 06/13/17, osteopenia, calcium recommended, f/u in 2 yrs. - Pt states that she never received the results of her scan. She is not taking a calcium supplement but states she eats a lot of yogurt.  Immunizations Immunization History  Administered Date(s) Administered  . Influenza Split 06/12/2012, 03/25/2013  . Influenza-Unspecified 06/10/2016, 04/23/2017  . Pneumococcal Conjugate-13 12/22/2015  . Pneumococcal  Polysaccharide-23 02/12/2017  . Pneumococcal-Unspecified 07/09/2009  . Tdap 07/09/2009  Shingles: sent to pharmacy; pt does not recall ever having chickenpox Flu: today  Fall Screening No falls within the past yr.  Depression Screening Depression screen [facility] 2/9 05/01/2018 03/14/2017 02/28/2017 02/12/2017 12/22/2015  Decreased Interest 0 0 0 0 0  Down, Depressed, Hopeless 0 0 0 0 0  PHQ - 2 Score 0 0 0 0 0   Functional Status Survey: Is the patient deaf or have difficulty hearing?: No Does the patient have difficulty seeing, even when wearing glasses/contacts?: No Does the patient have difficulty concentrating, remembering, or making decisions?: No Does the patient have difficulty walking or climbing stairs?: No Does the patient have difficulty dressing or bathing?: No Does the patient have difficulty doing errands alone such as visiting a doctor's office or shopping?: No  Mental Status Screening 6CIT Screen 05/01/2018  What Year? 0 points  What month? 0 points  What time? 0 points  Count back from 20 0 points  Months in reverse 0 points  Repeat phrase 0 points  Total Score 0     Visual Acuity Screening   Right eye Left eye Both eyes  Without correction:     With correction: '20/40 20/40 20/25 '   Vision: slight decline in vision bilat from 2018 but with both eyes vision is the same; has not seen an eye doctor in a few yrs Dentist: followed every 6 months; appointment in Nov Exercise: swims and walks  Advanced Directives  Living will, copy requested.  Drooling Pt states she occasionally drools while sleeping and occasionally while talking with people.  Rash Pt reports rash that she attributes to eczema which is followed by dermatology.  Patient Active Problem List   Diagnosis Date Noted  . Psoriasis 11/05/2013  . Smoking 11/05/2013  . Lipid disorder 11/09/2011  . Hypothyroid 11/09/2011   Past Medical History:  Diagnosis Date  . Allergy   . Thyroid disease    Past  Surgical History:  Procedure Laterality Date  . ABDOMINAL HYSTERECTOMY     No Known Allergies Prior to Admission medications   Medication Sig Start Date End Date Taking? Authorizing Provider  atorvastatin (LIPITOR) 20 MG tablet Take 1 tablet (20 mg total) by mouth daily. 02/12/17   Wendie Agreste, MD  fluticasone (FLONASE) 50 MCG/ACT nasal spray Place 2 sprays into both nostrils daily. 02/12/17   Wendie Agreste, MD  levothyroxine (SYNTHROID, LEVOTHROID) 75 MCG tablet Take 1 tablet (75 mcg total) by mouth daily. 03/13/18   Wendie Agreste, MD  meloxicam (MOBIC) 7.5 MG tablet Take 1 tablet (7.5 mg total) by mouth daily. 03/14/17   Wendie Agreste, MD  Multiple Vitamin (MULTIVITAMIN) tablet Take 1 tablet by mouth daily.    [provider]   Social History   Socioeconomic History  . Marital status: Single    Spouse name: Not on file  . Number of children: Not on file  . Years of education: Not on file  . Highest education level: Not on file  Occupational History  . Occupation: wait person  Social Needs  . Financial resource strain: Not on file  . Food insecurity:    Worry: Not on file    Inability: Not on file  . Transportation needs:    Medical: Not on file    Non-medical: Not on file  Tobacco Use  . Smoking status: Former Smoker    Packs/day: 1.00    Years: 50.00    Pack years: 50.00    Types: Cigarettes    Last attempt to quit: 07/03/2015    Years since quitting: 2.8  . Smokeless tobacco: Never Used  Substance and Sexual Activity  . Alcohol use: Yes    Alcohol/week: 0.0 standard drinks  . Drug use: No  . Sexual activity: Never  Lifestyle  . Physical activity:    Days per week: Not on file    Minutes per session: Not on file  . Stress: Not on file  Relationships  . Social connections:    Talks on phone: Not on file    Gets together: Not on file    Attends religious service: Not on file    Active member of club or organization: Not on file    Attends  meetings of clubs or organizations: Not on file    Relationship status: Not on file  . Intimate partner violence:    Fear of current or ex partner: Not on file    Emotionally abused: Not on file    Physically abused: Not on file    Forced sexual activity: Not on file  Other Topics Concern  . 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  HENT: Positive for drooling (intermittent).   Endocrine: Negative for cold intolerance and heat intolerance.  Musculoskeletal: Negative for arthralgias and neck pain.  Skin: Positive for rash. Negative  for color change.      Objective:   Physical Exam  Constitutional: She is oriented to person, place, and time. She appears well-developed and well-nourished.  HENT:  Head: Normocephalic and atraumatic.  Right Ear: External ear normal.  Left Ear: External ear normal.  Mouth/Throat: Oropharynx is clear and moist.  Eyes: Pupils are equal, round, and reactive to light. Conjunctivae are normal.  Neck: Normal range of motion. Neck supple. No thyromegaly present.  Cardiovascular: Normal rate, regular rhythm, normal heart sounds and intact distal pulses.  No murmur heard. Pulmonary/Chest: Effort normal and breath sounds normal. No respiratory distress. She has no wheezes.  Abdominal: Soft. Bowel sounds are normal. There is no tenderness.  Musculoskeletal: Normal range of motion. She exhibits no edema or tenderness.  Lymphadenopathy:    She has no cervical adenopathy.  Neurological: She is alert and oriented to person, place, and time.  Skin: Skin is warm and dry. No rash noted.  Psychiatric: She has a normal mood and affect. Her behavior is normal. Thought content normal.  Vitals reviewed.    Vitals:   05/01/18 0906  BP: 135/73  Pulse: 60  Temp: 98 F (36.7 C)  TempSrc: Oral  SpO2: 96%  Weight: 202 lb (91.6 kg)  Height: 5'  4" (1.626 m)      Assessment & Plan:    MALIHA OUTTEN is a 72 y.o. female Medicare annual wellness visit, subsequent  -  - anticipatory guidance as below in AVS, screening labs if needed. Health maintenance items as above in HPI discussed/recommended as applicable.   - no concerning responses on depression, fall, or functional status screening. Any positive responses noted as above. Advanced directives discussed as in CHL.   Hyperlipidemia, unspecified hyperlipidemia type - Plan: atorvastatin (LIPITOR) 20 MG tablet, Comprehensive metabolic panel, Lipid panel  -  Stable, tolerating current regimen. Medications refilled. Labs pending as above.   recurrent sinus and ear problems thought due to allergic rhinitis - Plan: fluticasone (FLONASE) 50 MCG/ACT nasal spray  - refilled flonase - no changes.   Hypothyroidism, unspecified type - Plan: levothyroxine (SYNTHROID, LEVOTHROID) 75 MCG tablet, TSH  -  Stable, tolerating current regimen. Medications refilled. Labs pending as above.   Needs flu shot - Plan: Flu vaccine HIGH DOSE PF (Fluzone High dose)  Need for shingles vaccine - Plan: Zoster Vaccine Adjuvanted [facility]) injection to pharmacy.    Meds ordered this encounter  Medications  . atorvastatin (LIPITOR) 20 MG tablet    Sig: Take 1 tablet (20 mg total) by mouth daily.    Dispense:  90 tablet    Refill:  3  . fluticasone (FLONASE) 50 MCG/ACT nasal spray    Sig: Place 2 sprays into both nostrils daily.    Dispense:  16 g    Refill:  11  . levothyroxine (SYNTHROID, LEVOTHROID) 75 MCG tablet    Sig: Take 1 tablet (75 mcg total) by mouth daily.    Dispense:  90 tablet    Refill:  3  . Zoster Vaccine Adjuvanted [facility]) injection    Sig: Inject 0.5 mLs into the muscle once for 1 dose. Repeat in 2-6 months.    Dispense:  0.5 mL    Refill:  1   Patient Instructions    We recommend that you schedule a mammogram for breast cancer screening. Typically, you do not need a  referral to do this. Please contact [place] - [phone]  Shingles vaccine at your pharmacy.  I recommend scheduling appointment with eye specialist.   thanks for coming in today.    Preventive Care 78 Years and Older, Female Preventive care refers to lifestyle choices and visits with your health care provider that can promote health and wellness. What does preventive care include?  A yearly physical exam. This is also called an annual well check.  Dental exams once or twice a year.  Routine eye exams. Ask your health care provider how often you should have your eyes checked.  Personal lifestyle choices, including: ? Daily care of your teeth and gums. ? Regular physical activity. ? Eating a healthy diet. ? Avoiding tobacco and drug use. ? Limiting alcohol use. ? Practicing safe sex. ? Taking low-dose aspirin every day. ? Taking vitamin and mineral supplements as recommended by your health care provider. What happens during an annual well check? The services and screenings done by your health care provider during your annual well check will depend on your age, overall health, lifestyle risk factors, and family history of disease. Counseling Your health care provider may ask you questions about your:  Alcohol use.  Tobacco use.  Drug use.  Emotional well-being.  Home and relationship well-being.  Sexual activity.  Eating habits.  History of falls.  Memory and ability to understand (cognition).  Work and work Statistician.  Reproductive health.  Screening You may have the following tests or measurements:  Height, weight, and BMI.  Blood pressure.  Lipid and cholesterol levels. These may be checked every 5 years, or more frequently if you are over 54 years old.  Skin check.  Lung cancer screening. You may have this screening every year starting at age 20 if you have a 30-pack-year history of smoking and currently smoke or have quit  within the past 15 years.  Fecal occult blood test (FOBT) of the stool. You may have this test every year starting at age 58.  Flexible sigmoidoscopy or colonoscopy. You may have a sigmoidoscopy every 5 years or a colonoscopy every 10 years starting at age 40.  Hepatitis C blood test.  Hepatitis B blood test.  Sexually transmitted disease (STD) testing.  Diabetes screening. This is done by checking your blood sugar (glucose) after you have not eaten for a while (fasting). You may have this done every 1-3 years.  Bone density scan. This is done to screen for osteoporosis. You may have this done starting at age 28.  Mammogram. This may be done every 1-2 years. Talk to your health care provider about how often you should have regular mammograms.  Talk with your health care provider about your test results, treatment options, and if necessary, the need for more tests. Vaccines Your health care provider may recommend certain vaccines, such as:  Influenza vaccine. This is recommended every year.  Tetanus, diphtheria, and acellular pertussis (Tdap, Td) vaccine. You may need a Td booster every 10 years.  Varicella vaccine. You may need this if you have not been vaccinated.  Zoster vaccine. You may need this after age 56.  Measles, mumps, and rubella (MMR) vaccine. You may need at least one dose of MMR if you were born in 1957 or later. You may also need a second dose.  Pneumococcal 13-valent conjugate (PCV13) vaccine. One dose is recommended after age 103.  Pneumococcal polysaccharide (PPSV23) vaccine. One dose is recommended after age 7.  Meningococcal vaccine. You may need this if you have certain conditions.  Hepatitis A vaccine. You may need this if you have  certain conditions or if you travel or work in places where you may be exposed to hepatitis A.  Hepatitis B vaccine. You may need this if you have certain conditions or if you travel or work in places where you may be exposed  to hepatitis B.  Haemophilus influenzae type b (Hib) vaccine. You may need this if you have certain conditions.  Talk to your health care provider about which screenings and vaccines you need and how often you need them. This information is not intended to replace advice given to you by your health care provider. Make sure you discuss any questions you have with your health care provider. Document Released: 07/22/2015 Document Revised: 03/14/2016 Document Reviewed: 04/26/2015 Elsevier Interactive Patient Education  [employer].    If you have lab work done today you will be contacted with your lab results within the next 2 weeks.  If you have not heard from [place] then please contact us. The fastest way to get your results is to register for My Chart.   IF you received an x-ray today, you will receive an invoice from [facility]. Please contact [facility] at [phone] with questions or concerns regarding your invoice.   IF you received labwork today, you will receive an invoice from [place]. Please contact [facility] at [phone] with questions or concerns regarding your invoice.   Our billing staff will not be able to assist you with questions regarding bills from these companies.  You will be contacted with the lab results as soon as they are available. The fastest way to get your results is to activate your My Chart account. Instructions are located on the last page of this paperwork. If you have not heard from [place] regarding the results in 2 weeks, please contact this office.       I personally performed the services described in this documentation, which was scribed in my presence. The recorded information has been reviewed and considered for accuracy and completeness, addended by me as needed, and agree with information above.  Signed,   [name], MD Primary Care at [place].  05/01/18 6:04 PM

## 2018-05-01 NOTE — Patient Instructions (Addendum)
We recommend that you schedule a mammogram for breast cancer screening. Typically, you do not need a referral to do this. Please contact [place] - [phone]  Shingles vaccine at your pharmacy.   I recommend scheduling appointment with eye specialist.   thanks for coming in today.    Preventive Care 73 Years and Older, Female Preventive care refers to lifestyle choices and visits with your health care provider that can promote health and wellness. What does preventive care include?  A yearly physical exam. This is also called an annual well check.  Dental exams once or twice a year.  Routine eye exams. Ask your health care provider how often you should have your eyes checked.  Personal lifestyle choices, including: ? Daily care of your teeth and gums. ? Regular physical activity. ? Eating a healthy diet. ? Avoiding tobacco and drug use. ? Limiting alcohol use. ? Practicing safe sex. ? Taking low-dose aspirin every day. ? Taking vitamin and mineral supplements as recommended by your health care provider. What happens during an annual well check? The services and screenings done by your health care provider during your annual well check will depend on your age, overall health, lifestyle risk factors, and family history of disease. Counseling Your health care provider may ask you questions about your:  Alcohol use.  Tobacco use.  Drug use.  Emotional well-being.  Home and relationship well-being.  Sexual activity.  Eating habits.  History of falls.  Memory and ability to understand (cognition).  Work and work Statistician.  Reproductive health.  Screening You may have the following tests or measurements:  Height, weight, and BMI.  Blood pressure.  Lipid and cholesterol levels. These may be checked every 5 years, or more frequently if you are over 90 years old.  Skin check.  Lung cancer screening. You may have this screening every year  starting at age 54 if you have a 30-pack-year history of smoking and currently smoke or have quit within the past 15 years.  Fecal occult blood test (FOBT) of the stool. You may have this test every year starting at age 50.  Flexible sigmoidoscopy or colonoscopy. You may have a sigmoidoscopy every 5 years or a colonoscopy every 10 years starting at age 39.  Hepatitis C blood test.  Hepatitis B blood test.  Sexually transmitted disease (STD) testing.  Diabetes screening. This is done by checking your blood sugar (glucose) after you have not eaten for a while (fasting). You may have this done every 1-3 years.  Bone density scan. This is done to screen for osteoporosis. You may have this done starting at age 71.  Mammogram. This may be done every 1-2 years. Talk to your health care provider about how often you should have regular mammograms.  Talk with your health care provider about your test results, treatment options, and if necessary, the need for more tests. Vaccines Your health care provider may recommend certain vaccines, such as:  Influenza vaccine. This is recommended every year.  Tetanus, diphtheria, and acellular pertussis (Tdap, Td) vaccine. You may need a Td booster every 10 years.  Varicella vaccine. You may need this if you have not been vaccinated.  Zoster vaccine. You may need this after age 81.  Measles, mumps, and rubella (MMR) vaccine. You may need at least one dose of MMR if you were born in 1957 or later. You may also need a second dose.  Pneumococcal 13-valent conjugate (PCV13) vaccine. One dose is recommended after  age 70.  Pneumococcal polysaccharide (PPSV23) vaccine. One dose is recommended after age 70.  Meningococcal vaccine. You may need this if you have certain conditions.  Hepatitis A vaccine. You may need this if you have certain conditions or if you travel or work in places where you may be exposed to hepatitis A.  Hepatitis B vaccine. You may  need this if you have certain conditions or if you travel or work in places where you may be exposed to hepatitis B.  Haemophilus influenzae type b (Hib) vaccine. You may need this if you have certain conditions.  Talk to your health care provider about which screenings and vaccines you need and how often you need them. This information is not intended to replace advice given to you by your health care provider. Make sure you discuss any questions you have with your health care provider. Document Released: 07/22/2015 Document Revised: 03/14/2016 Document Reviewed: 04/26/2015 Elsevier Interactive Patient Education  [employer].    If you have lab work done today you will be contacted with your lab results within the next 2 weeks.  If you have not heard from [place] then please contact us. The fastest way to get your results is to register for My Chart.   IF you received an x-ray today, you will receive an invoice from [facility]. Please contact [facility]iology at [phone] with questions or concerns regarding your invoice.   IF you received labwork today, you will receive an invoice from [place]. Please contact [facility] at [phone] with questions or concerns regarding your invoice.   Our billing staff will not be able to assist you with questions regarding bills from these companies.  You will be contacted with the lab results as soon as they are available. The fastest way to get your results is to activate your My Chart account. Instructions are located on the last page of this paperwork. If you have not heard from [place] regarding the results in 2 weeks, please contact this office.

## 2018-05-02 LAB — COMPREHENSIVE METABOLIC PANEL
ALK PHOS: 63 IU/L (ref 39–117)
ALT: 12 IU/L (ref 0–32)
AST: 20 IU/L (ref 0–40)
Albumin/Globulin Ratio: 1.5 (ref 1.2–2.2)
Albumin: 4.4 g/dL (ref 3.5–4.8)
BUN/Creatinine Ratio: 28 (ref 12–28)
BUN: 21 mg/dL (ref 8–27)
Bilirubin Total: 0.7 mg/dL (ref 0.0–1.2)
CO2: 23 mmol/L (ref 20–29)
CREATININE: 0.75 mg/dL (ref 0.57–1.00)
Calcium: 9.6 mg/dL (ref 8.7–10.3)
Chloride: 100 mmol/L (ref 96–106)
GFR calc Af Amer: 91 mL/min/{1.73_m2} (ref >59)
GFR calc non Af Amer: 79 mL/min/{1.73_m2} (ref >59)
GLUCOSE: 98 mg/dL (ref 65–99)
Globulin, Total: 2.9 g/dL (ref 1.5–4.5)
Potassium: 4.5 mmol/L (ref 3.5–5.2)
Sodium: 143 mmol/L (ref 134–144)
Total Protein: 7.3 g/dL (ref 6.0–8.5)

## 2018-05-02 LAB — LIPID PANEL
CHOL/HDL RATIO: 2.9 ratio (ref 0.0–4.4)
CHOLESTEROL TOTAL: 174 mg/dL (ref 100–199)
HDL: 60 mg/dL (ref >39)
LDL CALC: 90 mg/dL (ref 0–99)
Triglycerides: 121 mg/dL (ref 0–149)
VLDL Cholesterol Cal: 24 mg/dL (ref 5–40)

## 2018-05-02 LAB — TSH: TSH: 1.63 u[IU]/mL (ref 0.450–4.500)

## 2018-05-17 ENCOUNTER — Encounter: Payer: Self-pay | Admitting: Radiology

## 2018-09-12 DIAGNOSIS — H2513 Age-related nuclear cataract, bilateral: Secondary | ICD-10-CM | POA: Diagnosis not present

## 2019-02-04 DIAGNOSIS — Z03818 Encounter for observation for suspected exposure to other biological agents ruled out: Secondary | ICD-10-CM | POA: Diagnosis not present

## 2019-02-04 DIAGNOSIS — U071 COVID-19: Secondary | ICD-10-CM | POA: Diagnosis not present

## 2019-05-15 ENCOUNTER — Other Ambulatory Visit: Payer: Self-pay

## 2019-05-15 ENCOUNTER — Telehealth: Payer: Self-pay | Admitting: Family Medicine

## 2019-05-15 ENCOUNTER — Other Ambulatory Visit: Payer: Self-pay | Admitting: Family Medicine

## 2019-05-15 DIAGNOSIS — E039 Hypothyroidism, unspecified: Secondary | ICD-10-CM

## 2019-05-15 MED ORDER — LEVOTHYROXINE SODIUM 75 MCG PO TABS: 75.0000 ug | ORAL_TABLET | Freq: Every day | ORAL | 0 refills | Status: DC

## 2019-05-15 NOTE — Telephone Encounter (Signed)
Pt is needing a med refill of her levothyroxine. I made her an appt for a CPE for 06/10/2019. So she is needing enough to last her until the appt. Pharmacy on file is correct.

## 2019-05-15 NOTE — Patient Outreach (Signed)
[facility]) Care Management  05/15/2019  Stephanie Carter [birth year] OK:[id number]   Medication Adherence call to Stephanie Carter HIPPA Compliant Voice message left with a call back number. Stephanie Carter is showing past due on Atorvastatin 20 mg under [name].   [place] Management Direct Dial [phone]  Fax [phone] [name]

## 2019-05-15 NOTE — Telephone Encounter (Signed)
Requested medication (s) are due for refill today: yes  Requested medication (s) are on the active medication list: yes  Last refill:  05/15/2019  Future visit scheduled: yes  Notes to clinic: Patient requesting a 90 day supply   Requested Prescriptions  Pending Prescriptions Disp Refills   levothyroxine (SYNTHROID) 75 MCG tablet [Pharmacy Med Name: LEVOTHYROXINE 0.075MG  (75MCG) TABS] 90 tablet     Sig: TAKE 1 TABLET(75 MCG) BY MOUTH DAILY     Endocrinology:  Hypothyroid Agents Failed - 05/15/2019  3:21 PM      Failed - TSH needs to be rechecked within 3 months after an abnormal result. Refill until TSH is due.      Failed - TSH in normal range and within 360 days    TSH  Date Value Ref Range Status  05/01/2018 1.630 0.450 - 4.500 uIU/mL Final         Failed - Valid encounter within last 12 months    Recent Outpatient Visits          1 year ago Medicare annual wellness visit, subsequent   [facility] [place], Ranell Patrick, MD   2 years ago Left knee pain, unspecified chronicity   [facility] [place], Ranell Patrick, MD   2 years ago Left knee pain, unspecified chronicity   [facility] [place], Ranell Patrick, MD   2 years ago Medicare annual wellness visit, subsequent   [facility] [place], Ranell Patrick, MD   3 years ago Medicare annual wellness visit, subsequent   [facility] [place], Ranell Patrick, MD      Future Appointments            In 3 weeks Carlota Raspberry Ranell Patrick, MD Primary [facility] at [place], [facility]

## 2019-05-15 NOTE — Telephone Encounter (Signed)
Rx sent to pharmacy   

## 2019-05-21 ENCOUNTER — Other Ambulatory Visit: Payer: Self-pay | Admitting: Family Medicine

## 2019-05-21 DIAGNOSIS — E785 Hyperlipidemia, unspecified: Secondary | ICD-10-CM

## 2019-05-21 NOTE — Telephone Encounter (Signed)
Requested medication (s) are due for refill today: yes  Requested medication (s) are on the active medication list: yes  Last refill:  02/11/2019  Future visit scheduled: yes  Notes to clinic:  Review for refill Overdue for office visit    Requested Prescriptions  Pending Prescriptions Disp Refills   atorvastatin (LIPITOR) 20 MG tablet [Pharmacy Med Name: ATORVASTATIN 20MG  TABLETS] 90 tablet 3    Sig: TAKE 1 TABLET(20 MG) BY MOUTH DAILY     Cardiovascular:  Antilipid - Statins Failed - 05/21/2019 11:30 AM      Failed - Total Cholesterol in normal range and within 360 days    Cholesterol, Total  Date Value Ref Range Status  05/01/2018 174 100 - 199 mg/dL Final         Failed - LDL in normal range and within 360 days    LDL Calculated  Date Value Ref Range Status  05/01/2018 90 0 - 99 mg/dL Final         Failed - HDL in normal range and within 360 days    HDL  Date Value Ref Range Status  05/01/2018 60 >39 mg/dL Final         Failed - Triglycerides in normal range and within 360 days    Triglycerides  Date Value Ref Range Status  05/01/2018 121 0 - 149 mg/dL Final         Failed - Valid encounter within last 12 months    Recent Outpatient Visits          1 year ago Medicare annual wellness visit, subsequent   [facility], Ranell Patrick, MD   2 years ago Left knee pain, unspecified chronicity   [facility], Ranell Patrick, MD   2 years ago Left knee pain, unspecified chronicity   [facility], Ranell Patrick, MD   2 years ago Medicare annual wellness visit, subsequent   [facility], Ranell Patrick, MD   3 years ago Medicare annual wellness visit, subsequent   [facility], Ranell Patrick, MD      Future Appointments            In 2 weeks Carlota Raspberry Ranell Patrick, MD [facility], [place] - Patient is not pregnant

## 2019-05-22 NOTE — Telephone Encounter (Signed)
Journey from [facility] called investigating the status of refill request.

## 2019-06-01 ENCOUNTER — Telehealth: Payer: Self-pay | Admitting: *Deleted

## 2019-06-01 NOTE — Telephone Encounter (Signed)
Schedule AWV.  

## 2019-06-10 ENCOUNTER — Ambulatory Visit (INDEPENDENT_AMBULATORY_CARE_PROVIDER_SITE_OTHER): Payer: Medicare Other | Admitting: Family Medicine

## 2019-06-10 ENCOUNTER — Other Ambulatory Visit: Payer: Self-pay

## 2019-06-10 ENCOUNTER — Encounter: Payer: Self-pay | Admitting: Family Medicine

## 2019-06-10 VITALS — BP 134/75 | HR 64 | Temp 97.2°F | Wt 202.6 lb

## 2019-06-10 DIAGNOSIS — Z23 Encounter for immunization: Secondary | ICD-10-CM

## 2019-06-10 DIAGNOSIS — M8580 Other specified disorders of bone density and structure, unspecified site: Secondary | ICD-10-CM | POA: Diagnosis not present

## 2019-06-10 DIAGNOSIS — H65193 Other acute nonsuppurative otitis media, bilateral: Secondary | ICD-10-CM | POA: Diagnosis not present

## 2019-06-10 DIAGNOSIS — E785 Hyperlipidemia, unspecified: Secondary | ICD-10-CM | POA: Diagnosis not present

## 2019-06-10 DIAGNOSIS — Z0000 Encounter for general adult medical examination without abnormal findings: Secondary | ICD-10-CM

## 2019-06-10 DIAGNOSIS — Z0001 Encounter for general adult medical examination with abnormal findings: Secondary | ICD-10-CM | POA: Diagnosis not present

## 2019-06-10 DIAGNOSIS — E2839 Other primary ovarian failure: Secondary | ICD-10-CM

## 2019-06-10 DIAGNOSIS — E039 Hypothyroidism, unspecified: Secondary | ICD-10-CM | POA: Diagnosis not present

## 2019-06-10 MED ORDER — FLUTICASONE PROPIONATE 50 MCG/ACT NA SUSP: 2.0000 | Freq: Every day | NASAL | 11 refills | Status: DC

## 2019-06-10 MED ORDER — LEVOTHYROXINE SODIUM 75 MCG PO TABS: ORAL_TABLET | ORAL | 3 refills | Status: DC

## 2019-06-10 MED ORDER — ATORVASTATIN CALCIUM 20 MG PO TABS: ORAL_TABLET | ORAL | 3 refills | Status: DC

## 2019-06-10 NOTE — Patient Instructions (Addendum)
I do recommend shingles vaccine.  Let me know if there are questions. I did place an order for the bone density and hopefully can have that done at the same time as mammogram in the next few months. No change in medications for now, I will check some lab work today.  Keep up the good work with exercise, and stay safe.   Preventive Care 13 Years and Older, Female Preventive care refers to lifestyle choices and visits with your health care provider that can promote health and wellness. This includes:  A yearly physical exam. This is also called an annual well check.  Regular dental and eye exams.  Immunizations.  Screening for certain conditions.  Healthy lifestyle choices, such as diet and exercise. What can I expect for my preventive care visit? Physical exam Your health care provider will check:  Height and weight. These may be used to calculate body mass index (BMI), which is a measurement that tells if you are at a healthy weight.  Heart rate and blood pressure.  Your skin for abnormal spots. Counseling Your health care provider may ask you questions about:  Alcohol, tobacco, and drug use.  Emotional well-being.  Home and relationship well-being.  Sexual activity.  Eating habits.  History of falls.  Memory and ability to understand (cognition).  Work and work Statistician.  Pregnancy and menstrual history. What immunizations do I need?  Influenza (flu) vaccine  This is recommended every year. Tetanus, diphtheria, and pertussis (Tdap) vaccine  You may need a Td booster every 10 years. Varicella (chickenpox) vaccine  You may need this vaccine if you have not already been vaccinated. Zoster (shingles) vaccine  You may need this after age 74. Pneumococcal conjugate (PCV13) vaccine  One dose is recommended after age 14. Pneumococcal polysaccharide (PPSV23) vaccine  One dose is recommended after age 74. Measles, mumps, and rubella (MMR) vaccine  You  may need at least one dose of MMR if you were born in 1957 or later. You may also need a second dose. Meningococcal conjugate (MenACWY) vaccine  You may need this if you have certain conditions. Hepatitis A vaccine  You may need this if you have certain conditions or if you travel or work in places where you may be exposed to hepatitis A. Hepatitis B vaccine  You may need this if you have certain conditions or if you travel or work in places where you may be exposed to hepatitis B. Haemophilus influenzae type b (Hib) vaccine  You may need this if you have certain conditions. You may receive vaccines as individual doses or as more than one vaccine together in one shot (combination vaccines). Talk with your health care provider about the risks and benefits of combination vaccines. What tests do I need? Blood tests  Lipid and cholesterol levels. These may be checked every 5 years, or more frequently depending on your overall health.  Hepatitis C test.  Hepatitis B test. Screening  Lung cancer screening. You may have this screening every year starting at age 74 if you have a 30-pack-year history of smoking and currently smoke or have quit within the past 15 years.  Colorectal cancer screening. All adults should have this screening starting at age 74 and continuing until age 71. Your health care provider may recommend screening at age 31 if you are at increased risk. You will have tests every 1-10 years, depending on your results and the type of screening test.  Diabetes screening. This is done by checking  your blood sugar (glucose) after you have not eaten for a while (fasting). You may have this done every 1-3 years.  Mammogram. This may be done every 1-2 years. Talk with your health care provider about how often you should have regular mammograms.  BRCA-related cancer screening. This may be done if you have a family history of breast, ovarian, tubal, or peritoneal cancers. Other  tests  Sexually transmitted disease (STD) testing.  Bone density scan. This is done to screen for osteoporosis. You may have this done starting at age 74. Follow these instructions at home: Eating and drinking  Eat a diet that includes fresh fruits and vegetables, whole grains, lean protein, and low-fat dairy products. Limit your intake of foods with high amounts of sugar, saturated fats, and salt.  Take vitamin and mineral supplements as recommended by your health care provider.  Do not drink alcohol if your health care provider tells you not to drink.  If you drink alcohol: ? Limit how much you have to 0-1 drink a day. ? Be aware of how much alcohol is in your drink. In the [place] one drink equals one 12 oz bottle of beer (355 mL), one 5 oz glass of wine (148 mL), or one 1 oz glass of hard liquor (44 mL). Lifestyle  Take daily care of your teeth and gums.  Stay active. Exercise for at least 30 minutes on 5 or more days each week.  Do not use any products that contain nicotine or tobacco, such as cigarettes, e-cigarettes, and chewing tobacco. If you need help quitting, ask your health care provider.  If you are sexually active, practice safe sex. Use a condom or other form of protection in order to prevent STIs (sexually transmitted infections).  Talk with your health care provider about taking a low-dose aspirin or statin. What's next?  Go to your health care provider once a year for a well check visit.  Ask your health care provider how often you should have your eyes and teeth checked.  Stay up to date on all vaccines. This information is not intended to replace advice given to you by your health care provider. Make sure you discuss any questions you have with your health care provider. Document Released: 07/22/2015 Document Revised: 06/19/2018 Document Reviewed: 06/19/2018 Elsevier Patient Education  [employer].    If you have lab work done today you will be  contacted with your lab results within the next 2 weeks.  If you have not heard from [place] then please contact us. The fastest way to get your results is to register for My Chart.   IF you received an x-ray today, you will receive an invoice from [facility]. Please contact [facility] at [phone] with questions or concerns regarding your invoice.   IF you received labwork today, you will receive an invoice from [facility]. Please contact [facility] at [phone] with questions or concerns regarding your invoice.   Our billing staff will not be able to assist you with questions regarding bills from these companies.  You will be contacted with the lab results as soon as they are available. The fastest way to get your results is to activate your My Chart account. Instructions are located on the last page of this paperwork. If you have not heard from [place] regarding the results in 2 weeks, please contact this office.

## 2019-06-10 NOTE — Progress Notes (Signed)
Subjective:  Patient ID: Stephanie Carter, female    DOB: 07/21/1945  Age: 73 y.o. MRN: [id number]  CC:  Chief Complaint  Patient presents with  . Annual Exam     physical no other issues at this time. Need a refill on nasal spray   HPI Stephanie Carter presents for   Annual wellness exam, med review No health changes since last visit.  Care team: Me as PCP only.   Allergic rhinitis Flonase as needed only.   Hyperlipidemia: Lipitor 20 mg daily Rx - taking every other day. Some muscle aches daily -  less QOD.  Lab Results  Component Value Date   CHOL 174 05/01/2018   HDL 60 05/01/2018   LDLCALC 90 05/01/2018   TRIG 121 05/01/2018   CHOLHDL 2.9 05/01/2018   Lab Results  Component Value Date   ALT 12 05/01/2018   AST 20 05/01/2018   ALKPHOS 63 05/01/2018   BILITOT 0.7 05/01/2018   Hypothyroidism: Lab Results  Component Value Date   TSH 1.630 05/01/2018  Taking medication daily.  Synthroid 75 mcg daily.  No new hot or cold intolerance - occasional cold. No new hair or skin changes, heart palpitations or new fatigue. No new weight changes.  Wt Readings from Last 3 Encounters:  06/10/19 202 lb 9.6 oz (91.9 kg)  05/01/18 202 lb (91.6 kg)  03/14/17 216 lb (98 kg)    Cancer screening: Colonoscopy 12/31/2017, repeat 3 to 5 years with multiple polyps removed Mammogram 03/01/2016.  Scheduled earlier this year, but cancelled with pandemic.  Diagnosis screening, DEXA scan 06/13/2017 with osteopenia, calcium recommended with repeat screening in 2 years. Eats yogurt and calcium supplement otc.   Immunization History  Administered Date(s) Administered  . Fluad Quad(high Dose 65+) 06/10/2019  . Influenza Split 06/12/2012, 03/25/2013  . Influenza, High Dose Seasonal PF 05/01/2018  . Influenza-Unspecified 06/10/2016, 04/23/2017  . Pneumococcal Conjugate-13 12/22/2015  . Pneumococcal Polysaccharide-23 02/12/2017  . Pneumococcal-Unspecified 07/09/2009  . Tdap 07/09/2009   Shingles vaccine, sent to pharmacy last year - did not have. Declines at this time. Will talk to pharmacist.  Fall Risk  06/10/2019 05/01/2018 03/14/2017 02/28/2017 02/12/2017  Falls in the past year? 0 No No No No  Number falls in past yr: 0 - - - -  Injury with Fall? 0 - - - -  Follow up Falls evaluation completed - - - -  adequate lighting at home.  1 loose rug in living room - removed others.  Has handles in bathroom.   Depression screen [facility] 2/9 06/10/2019 05/01/2018 03/14/2017 02/28/2017 02/12/2017  Decreased Interest 0 0 0 0 0  Down, Depressed, Hopeless 0 0 0 0 0  PHQ - 2 Score 0 0 0 0 0   Functional Status Survey: Is the patient deaf or have difficulty hearing?: No Does the patient have difficulty seeing, even when wearing glasses/contacts?: No Does the patient have difficulty concentrating, remembering, or making decisions?: No Does the patient have difficulty walking or climbing stairs?: No Does the patient have difficulty dressing or bathing?: No Does the patient have difficulty doing errands alone such as visiting a doctor's office or shopping?: No   Memory screen: 6CIT Screen 06/10/2019 05/01/2018  What Year? 0 points 0 points  What month? 0 points 0 points  What time? 0 points 0 points  Count back from 20 0 points 0 points  Months in reverse 0 points 0 points  Repeat phrase 0 points 0 points  Total Score  0 0    Hearing Screening   '125Hz'  '250Hz'  '500Hz'  '1000Hz'  '2000Hz'  '3000Hz'  '4000Hz'  '6000Hz'  '8000Hz'   Right ear:           Left ear:             Visual Acuity Screening   Right eye Left eye Both eyes  Without correction: '20/50 20/50 20/40 '  With correction:     optho: seen earlier this year.   Dental: recent visit, every 44movisits.   Exercise: [employer] 3 days per week - water exercise, walking most days.   Advanced directives: Has healthcare power of attorney as well as living will.    Office Visit from 06/10/2019 in [facility] AUDIT-C Score  1     Quit smoking about  3 yrs ago.    History Patient Active Problem List   Diagnosis Date Noted  . Psoriasis 11/05/2013  . Smoking 11/05/2013  . Lipid disorder 11/09/2011  . Hypothyroid 11/09/2011   Past Medical History:  Diagnosis Date  . Allergy   . Thyroid disease    Past Surgical History:  Procedure Laterality Date  . ABDOMINAL HYSTERECTOMY     No Known Allergies Prior to Admission medications   Medication Sig Start Date End Date Taking? Authorizing Provider  calcium gluconate 500 MG tablet Take 1 tablet by mouth 3 (three) times daily.   Yes [provider]  atorvastatin (LIPITOR) 20 MG tablet TAKE 1 TABLET(20 MG) BY MOUTH DAILY 05/25/19   GWendie Agreste MD  fluticasone ([facility] 50 MCG/ACT nasal spray Place 2 sprays into both nostrils daily. 05/01/18   GWendie Agreste MD  levothyroxine (SYNTHROID) 75 MCG tablet TAKE 1 TABLET(75 MCG) BY MOUTH DAILY 05/19/19   GWendie Agreste MD  Multiple Vitamin (MULTIVITAMIN) tablet Take 1 tablet by mouth daily.    [provider]   Social History   Socioeconomic History  . Marital status: Single    Spouse name: Not on file  . Number of children: Not on file  . Years of education: Not on file  . Highest education level: Not on file  Occupational History  . Occupation: wait person  Social Needs  . Financial resource strain: Not on file  . Food insecurity    Worry: Not on file    Inability: Not on file  . Transportation needs    Medical: Not on file    Non-medical: Not on file  Tobacco Use  . Smoking status: Former Smoker    Packs/day: 1.00    Years: 50.00    Pack years: 50.00    Types: Cigarettes    Quit date: 07/03/2015    Years since quitting: 3.9  . Smokeless tobacco: Never Used  Substance and Sexual Activity  . Alcohol use: Yes    Alcohol/week: 0.0 standard drinks  . Drug use: No  . Sexual activity: Never  Lifestyle  . Physical activity    Days per week: Not on file    Minutes per session: Not on file  .  Stress: Not on file  Relationships  . Social [place] phone: Not on file    Gets together: Not on file    Attends religious service: Not on file    Active member of club or organization: Not on file    Attends meetings of clubs or organizations: Not on file    Relationship status: Not on file  . Intimate partner violence    Fear of  current or ex partner: Not on file    Emotionally abused: Not on file    Physically abused: Not on file    Forced sexual activity: Not on file  Other Topics Concern  . 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  Negative other than as indicated above or in HPI.    Objective:   Vitals:   06/10/19 1014  BP: 134/75  Pulse: 64  Temp: (!) 97.2 F (36.2 C)  TempSrc: Oral  SpO2: 98%  Weight: 202 lb 9.6 oz (91.9 kg)     Physical Exam Constitutional:      Appearance: She is well-developed.  HENT:     Head: Normocephalic and atraumatic.     Right Ear: External ear normal.     Left Ear: External ear normal.  Eyes:     Conjunctiva/sclera: Conjunctivae normal.     Pupils: Pupils are equal, round, and reactive to light.  Neck:     Musculoskeletal: Normal range of motion and neck supple.     Thyroid: No thyromegaly.  Cardiovascular:     Rate and Rhythm: Normal rate and regular rhythm.     Heart sounds: Normal heart sounds. No murmur.  Pulmonary:     Effort: Pulmonary effort is normal. No respiratory distress.     Breath sounds: Normal breath sounds. No wheezing.  Abdominal:     General: Bowel sounds are normal.     Palpations: Abdomen is soft.     Tenderness: There is no abdominal tenderness.  Musculoskeletal: Normal range of motion.        General: No tenderness.  Lymphadenopathy:     Cervical: No cervical adenopathy.  Skin:    General: Skin is warm and dry.     Findings: No rash.   Neurological:     Mental Status: She is alert and oriented to person, place, and time.  Psychiatric:        Behavior: Behavior normal.        Thought Content: Thought content normal.        Assessment & Plan:  Stephanie Carter is a 73 y.o. female . Medicare annual wellness visit, subsequent  - - anticipatory guidance as below in AVS, screening labs if needed. Health maintenance items as above in HPI discussed/recommended as applicable.  - no concerning responses on depression, fall, or functional status screening. Any positive responses noted as above. Advanced directives discussed as in CHL.   -Mammogram, bone density discussed.  Order placed.  Shingles vaccine also discussed, and will consider.  Hyperlipidemia, unspecified hyperlipidemia type - Plan: Lipid Panel, Comprehensive metabolic panel, atorvastatin (LIPITOR) 20 MG tablet  -Tolerating Lipitor current dose, check labs.  Refilled same  recurrent sinus and ear problems thought due to allergic rhinitis - Plan: fluticasone (FLONASE) 50 MCG/ACT nasal spray  -Stable with as needed use  Need for prophylactic vaccination and inoculation against influenza - Plan: Flu Vaccine QUAD High Dose(Fluad)  Osteopenia, unspecified location - Plan: DG Bone Density Estrogen deficiency - Plan: DG Bone Density  -Continue calcium supplementation, repeat testing ordered  Hypothyroidism, unspecified type - Plan: TSH, levothyroxine (SYNTHROID) 75 MCG tablet  - Stable, tolerating current regimen. Medications refilled. Labs pending as above.    Meds ordered this encounter  Medications  . fluticasone (FLONASE) 50 MCG/ACT nasal spray    Sig: Place 2  sprays into both nostrils daily.    Dispense:  16 g    Refill:  11  . atorvastatin (LIPITOR) 20 MG tablet    Sig: TAKE 1 TABLET(20 MG) BY MOUTH DAILY    Dispense:  90 tablet    Refill:  3  . levothyroxine (SYNTHROID) 75 MCG tablet    Sig: TAKE 1 TABLET(75 MCG) BY MOUTH DAILY    Dispense:  90  tablet    Refill:  3    **Patient requests 90 days supply**   Patient Instructions    I do recommend shingles vaccine.  Let me know if there are questions. I did place an order for the bone density and hopefully can have that done at the same time as mammogram in the next few months. No change in medications for now, I will check some lab work today.  Keep up the good work with exercise, and stay safe.   Preventive Care 37 Years and Older, Female Preventive care refers to lifestyle choices and visits with your health care provider that can promote health and wellness. This includes:  A yearly physical exam. This is also called an annual well check.  Regular dental and eye exams.  Immunizations.  Screening for certain conditions.  Healthy lifestyle choices, such as diet and exercise. What can I expect for my preventive care visit? Physical exam Your health care provider will check:  Height and weight. These may be used to calculate body mass index (BMI), which is a measurement that tells if you are at a healthy weight.  Heart rate and blood pressure.  Your skin for abnormal spots. Counseling Your health care provider may ask you questions about:  Alcohol, tobacco, and drug use.  Emotional well-being.  Home and relationship well-being.  Sexual activity.  Eating habits.  History of falls.  Memory and ability to understand (cognition).  Work and work Statistician.  Pregnancy and menstrual history. What immunizations do I need?  Influenza (flu) vaccine  This is recommended every year. Tetanus, diphtheria, and pertussis (Tdap) vaccine  You may need a Td booster every 10 years. Varicella (chickenpox) vaccine  You may need this vaccine if you have not already been vaccinated. Zoster (shingles) vaccine  You may need this after age 51. Pneumococcal conjugate (PCV13) vaccine  One dose is recommended after age 42. Pneumococcal polysaccharide (PPSV23) vaccine   One dose is recommended after age 26. Measles, mumps, and rubella (MMR) vaccine  You may need at least one dose of MMR if you were born in 1957 or later. You may also need a second dose. Meningococcal conjugate (MenACWY) vaccine  You may need this if you have certain conditions. Hepatitis A vaccine  You may need this if you have certain conditions or if you travel or work in places where you may be exposed to hepatitis A. Hepatitis B vaccine  You may need this if you have certain conditions or if you travel or work in places where you may be exposed to hepatitis B. Haemophilus influenzae type b (Hib) vaccine  You may need this if you have certain conditions. You may receive vaccines as individual doses or as more than one vaccine together in one shot (combination vaccines). Talk with your health care provider about the risks and benefits of combination vaccines. What tests do I need? Blood tests  Lipid and cholesterol levels. These may be checked every 5 years, or more frequently depending on your overall health.  Hepatitis C test.  Hepatitis B  test. Screening  Lung cancer screening. You may have this screening every year starting at age 30 if you have a 30-pack-year history of smoking and currently smoke or have quit within the past 15 years.  Colorectal cancer screening. All adults should have this screening starting at age 30 and continuing until age 93. Your health care provider may recommend screening at age 54 if you are at increased risk. You will have tests every 1-10 years, depending on your results and the type of screening test.  Diabetes screening. This is done by checking your blood sugar (glucose) after you have not eaten for a while (fasting). You may have this done every 1-3 years.  Mammogram. This may be done every 1-2 years. Talk with your health care provider about how often you should have regular mammograms.  BRCA-related cancer screening. This may be done  if you have a family history of breast, ovarian, tubal, or peritoneal cancers. Other tests  Sexually transmitted disease (STD) testing.  Bone density scan. This is done to screen for osteoporosis. You may have this done starting at age 76. Follow these instructions at home: Eating and drinking  Eat a diet that includes fresh fruits and vegetables, whole grains, lean protein, and low-fat dairy products. Limit your intake of foods with high amounts of sugar, saturated fats, and salt.  Take vitamin and mineral supplements as recommended by your health care provider.  Do not drink alcohol if your health care provider tells you not to drink.  If you drink alcohol: ? Limit how much you have to 0-1 drink a day. ? Be aware of how much alcohol is in your drink. In the [place] one drink equals one 12 oz bottle of beer (355 mL), one 5 oz glass of wine (148 mL), or one 1 oz glass of hard liquor (44 mL). Lifestyle  Take daily care of your teeth and gums.  Stay active. Exercise for at least 30 minutes on 5 or more days each week.  Do not use any products that contain nicotine or tobacco, such as cigarettes, e-cigarettes, and chewing tobacco. If you need help quitting, ask your health care provider.  If you are sexually active, practice safe sex. Use a condom or other form of protection in order to prevent STIs (sexually transmitted infections).  Talk with your health care provider about taking a low-dose aspirin or statin. What's next?  Go to your health care provider once a year for a well check visit.  Ask your health care provider how often you should have your eyes and teeth checked.  Stay up to date on all vaccines. This information is not intended to replace advice given to you by your health care provider. Make sure you discuss any questions you have with your health care provider. Document Released: 07/22/2015 Document Revised: 06/19/2018 Document Reviewed: 06/19/2018 Elsevier Patient  Education  [employer].    If you have lab work done today you will be contacted with your lab results within the next 2 weeks.  If you have not heard from [place] then please contact us. The fastest way to get your results is to register for My Chart.   IF you received an x-ray today, you will receive an invoice from [facility]. Please contact [facility] at [phone] with questions or concerns regarding your invoice.   IF you received labwork today, you will receive an invoice from [place]. Please contact [facility] at [phone] with questions or concerns regarding your invoice.  Our billing staff will not be able to assist you with questions regarding bills from these companies.  You will be contacted with the lab results as soon as they are available. The fastest way to get your results is to activate your My Chart account. Instructions are located on the last page of this paperwork. If you have not heard from [place] regarding the results in 2 weeks, please contact this office.          Signed, [name], MD [facility]

## 2019-06-11 LAB — COMPREHENSIVE METABOLIC PANEL
ALT: 18 IU/L (ref 0–32)
AST: 23 IU/L (ref 0–40)
Albumin/Globulin Ratio: 1.7 (ref 1.2–2.2)
Albumin: 4.4 g/dL (ref 3.7–4.7)
Alkaline Phosphatase: 59 IU/L (ref 39–117)
BUN/Creatinine Ratio: 24 (ref 12–28)
BUN: 20 mg/dL (ref 8–27)
Bilirubin Total: 0.7 mg/dL (ref 0.0–1.2)
CO2: 25 mmol/L (ref 20–29)
Calcium: 9.6 mg/dL (ref 8.7–10.3)
Chloride: 101 mmol/L (ref 96–106)
Creatinine, Ser: 0.83 mg/dL (ref 0.57–1.00)
GFR calc Af Amer: 80 mL/min/{1.73_m2} (ref >59)
GFR calc non Af Amer: 70 mL/min/{1.73_m2} (ref >59)
Globulin, Total: 2.6 g/dL (ref 1.5–4.5)
Glucose: 97 mg/dL (ref 65–99)
Potassium: 4.5 mmol/L (ref 3.5–5.2)
Sodium: 138 mmol/L (ref 134–144)
Total Protein: 7 g/dL (ref 6.0–8.5)

## 2019-06-11 LAB — LIPID PANEL
Chol/HDL Ratio: 2.9 ratio (ref 0.0–4.4)
Cholesterol, Total: 180 mg/dL (ref 100–199)
HDL: 62 mg/dL (ref >39)
LDL Chol Calc (NIH): 102 mg/dL — ABNORMAL HIGH (ref 0–99)
Triglycerides: 89 mg/dL (ref 0–149)
VLDL Cholesterol Cal: 16 mg/dL (ref 5–40)

## 2019-06-11 LAB — TSH: TSH: 1.36 u[IU]/mL (ref 0.450–4.500)

## 2019-06-23 ENCOUNTER — Encounter: Payer: Self-pay | Admitting: Radiology

## 2019-10-12 ENCOUNTER — Ambulatory Visit (INDEPENDENT_AMBULATORY_CARE_PROVIDER_SITE_OTHER): Payer: Medicare Other

## 2019-10-12 ENCOUNTER — Other Ambulatory Visit: Payer: Self-pay

## 2019-10-12 ENCOUNTER — Ambulatory Visit (INDEPENDENT_AMBULATORY_CARE_PROVIDER_SITE_OTHER): Payer: Medicare Other | Admitting: Family Medicine

## 2019-10-12 ENCOUNTER — Encounter: Payer: Self-pay | Admitting: Family Medicine

## 2019-10-12 VITALS — BP 139/83 | HR 63 | Temp 97.3°F | Ht 64.0 in | Wt 207.0 lb

## 2019-10-12 DIAGNOSIS — Z1239 Encounter for other screening for malignant neoplasm of breast: Secondary | ICD-10-CM | POA: Diagnosis not present

## 2019-10-12 DIAGNOSIS — M25562 Pain in left knee: Secondary | ICD-10-CM

## 2019-10-12 NOTE — Patient Instructions (Addendum)
I will place the order for mammogram for upcoming appointment.  Intermittent knee pain could be related to meniscus issues.  I will let you know if there are concerns on x-ray but will refer you to orthopedics to decide on possible injection or other treatment.  Episodic Tylenol is probably safer than NSAIDs such as ibuprofen or Aleve.Return to the clinic or go to the nearest emergency room if any of your symptoms worsen or new symptoms occur.   Chronic Knee Pain, Adult Chronic knee pain is pain in one or both knees that lasts longer than 3 months. Symptoms of chronic knee pain may include swelling, stiffness, and discomfort. Age-related wear and tear (osteoarthritis) of the knee joint is the most common cause of chronic knee pain. Other possible causes include:  A long-term immune-related disease that causes inflammation of the knee (rheumatoid arthritis). This usually affects both knees.  Inflammatory arthritis, such as gout or pseudogout.  An injury to the knee that causes arthritis.  An injury to the knee that damages the ligaments. Ligaments are strong tissues that connect bones to each other.  Runner's knee or pain behind the kneecap. Treatment for chronic knee pain depends on the cause. The main treatments for chronic knee pain are physical therapy and weight loss. This condition may also be treated with medicines, injections, a knee sleeve or brace, and by using crutches. Rest, ice, compression (pressure), and elevation (RICE) therapy may also be recommended. Follow these instructions at home: If you have a knee sleeve or brace:   Wear it as told by your health care provider. Remove it only as told by your health care provider.  Loosen it if your toes tingle, become numb, or turn cold and blue.  Keep it clean.  If the sleeve or brace is not waterproof: ? Do not let it get wet. ? Remove it if allowed by your health care provider, or cover it with a watertight covering when you  take a bath or a shower. Managing pain, stiffness, and swelling      If directed, apply heat to the affected area as often as told by your health care provider. Use the heat source that your health care provider recommends, such as a moist heat pack or a heating pad. ? If you have a removable sleeve or brace, remove it as told by your health care provider. ? Place a towel between your skin and the heat source. ? Leave the heat on for 20-30 minutes. ? Remove the heat if your skin turns bright red. This is especially important if you are unable to feel pain, heat, or cold. You may have a greater risk of getting burned.  If directed, put ice on the affected area. ? If you have a removable sleeve or brace, remove it as told by your health care provider. ? Put ice in a plastic bag. ? Place a towel between your skin and the bag. ? Leave the ice on for 20 minutes, 2-3 times a day.  Move your toes often to reduce stiffness and swelling.  Raise (elevate) the injured area above the level of your heart while you are sitting or lying down. Activity  Avoid activities where both feet leave the ground at the same time (high-impact activities). Examples are running, jumping rope, and doing jumping jacks.  Return to your normal activities as told by your health care provider. Ask your health care provider what activities are safe for you.  Follow the exercise plan that  your health care provider designed for you. Your health care provider may suggest that you: ? Avoid activities that make knee pain worse. This may require you to change your exercise routines, sport participation, or job duties. ? Wear shoes with cushioned soles. ? Avoid high-impact activities or sports that require running and sudden changes in direction. ? Do physical therapy as told by your health care provider. Physical therapy is planned to match your needs and abilities. It may include exercises for strength, flexibility,  stability, and endurance. ? Do exercises that increase balance and strength, such as tai chi and yoga.  Do not use the injured limb to support your body weight until your health care provider says that you can. Use crutches, a cane, or a walker, as told by your health care provider. General instructions  Take over-the-counter and prescription medicines only as told by your health care provider.  Lose weight if you are overweight. Losing even a little weight can reduce knee pain. Ask your health care provider what your ideal weight is, and how to safely lose extra weight. A food expert (dietitian) may be able to help you plan your meals.  Do not use any products that contain nicotine or tobacco, such as cigarettes, e-cigarettes, and chewing tobacco. These can delay healing. If you need help quitting, ask your health care provider.  Keep all follow-up visits as told by your health care provider. This is important. Contact a health care provider if:  You have knee pain that is not getting better or gets worse.  You are unable to do your physical therapy exercises due to knee pain. Get help right away if:  Your knee swells and the swelling becomes worse.  You cannot move your knee.  You have severe knee pain. Summary  Knee pain that lasts more than 3 months is considered chronic knee pain.  The main treatments for chronic knee pain are physical therapy and weight loss. You may also need to take medicines, wear a knee sleeve or brace, use crutches, and apply ice or heat.  Losing even a little weight can reduce knee pain. Ask your health care provider what your ideal weight is, and how to safely lose extra weight. A food expert (dietitian) may be able to help you plan your meals.  Work with a physical therapist to make a safe exercise program, as told by your health care provider. This information is not intended to replace advice given to you by your health care provider. Make sure you  discuss any questions you have with your health care provider. Document Revised: 09/04/2018 Document Reviewed: 09/04/2018 Elsevier Patient Education  [employer].    If you have lab work done today you will be contacted with your lab results within the next 2 weeks.  If you have not heard from [place] then please contact us. The fastest way to get your results is to register for My Chart.   IF you received an x-ray today, you will receive an invoice from [facility]. Please contact [facility] at [phone] with questions or concerns regarding your invoice.   IF you received labwork today, you will receive an invoice from [place]. Please contact [facility] at [phone] with questions or concerns regarding your invoice.   Our billing staff will not be able to assist you with questions regarding bills from these companies.  You will be contacted with the lab results as soon as they are available. The fastest way to get your  results is to activate your My Chart account. Instructions are located on the last page of this paperwork. If you have not heard from [place] regarding the results in 2 weeks, please contact this office.

## 2019-10-12 NOTE — Progress Notes (Signed)
Subjective:  Patient ID: Stephanie Carter, female    DOB: Jan 28, 1946  Age: 74 y.o. MRN: [id number]  CC:  Chief Complaint  Patient presents with  . Referral    pt would like a referral for a mamogram.  . Knee Pain    pt states her knee "dosn't work right". pt clames the inner part of her L knee is swollen and pain shoots up her leg at times. Pt would like a referral to a spesilist for this issue as well.    HPI Stephanie Carter presents for   Left knee pain Previous left knee x-ray in September 2018 with diffuse osseous demineralization but joint spaces were fairly well-preserved.  Intermittent pain. No known injury.  Active with water aerobics but lands on R knee.  Pivoted while cooking - felt pain in L knee - througout knee. Swelling on and off for years, more now.  Pain with kneeling at communion rail. Hurts to fully bend.  Cramping in calf at times. No calf swelling. No hx of DVT, No recent prolonged car travel or air travel, no recent calf swelling. Rare alleve - no relief. Ibuprofen - min relief, CBD oil - min relief.  Not limiting activities.  No wt loss,night sweats, fevers.   Health maintenance Request referral for mammogram.  Last mammogram on file 03/01/2016 at [facility].  No mammographic evidence of malignancy. Bone density also ordered along with mammogram in December at wellness visit. Has appt with [facility] on 4/13.   History Patient Active Problem List   Diagnosis Date Noted  . Psoriasis 11/05/2013  . Smoking 11/05/2013  . Lipid disorder 11/09/2011  . Hypothyroid 11/09/2011   Past Medical History:  Diagnosis Date  . Allergy   . Thyroid disease    Past Surgical History:  Procedure Laterality Date  . ABDOMINAL HYSTERECTOMY     No Known Allergies Prior to Admission medications   Medication Sig Start Date End Date Taking? Authorizing Provider  atorvastatin (LIPITOR) 20 MG tablet TAKE 1 TABLET(20 MG) BY MOUTH DAILY 06/10/19   Wendie Agreste, MD  calcium  gluconate 500 MG tablet Take 1 tablet by mouth 3 (three) times daily.    [provider]  fluticasone (FLONASE) 50 MCG/ACT nasal spray Place 2 sprays into both nostrils daily. 06/10/19   Wendie Agreste, MD  levothyroxine (SYNTHROID) 75 MCG tablet TAKE 1 TABLET(75 MCG) BY MOUTH DAILY 06/10/19   Wendie Agreste, MD  Multiple Vitamin (MULTIVITAMIN) tablet Take 1 tablet by mouth daily.    [provider]   Social History   Socioeconomic History  . Marital status: Single    Spouse name: Not on file  . Number of children: Not on file  . Years of education: Not on file  . Highest education level: Not on file  Occupational History  . Occupation: wait person  Tobacco Use  . Smoking status: Former Smoker    Packs/day: 1.00    Years: 50.00    Pack years: 50.00    Types: Cigarettes    Quit date: 07/03/2015    Years since quitting: 4.2  . Smokeless tobacco: Never Used  Substance and Sexual Activity  . Alcohol use: Yes    Alcohol/week: 0.0 standard drinks  . Drug use: No  . Sexual activity: Never  Other Topics Concern  . 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 Difficulty of Paying Living Expenses:   Food Insecurity:   . Worried About Charity fundraiser in the Last Year:   . Arboriculturist in the Last Year:   Transportation Needs:   . Film/video editor (Medical):   Marland Kitchen Lack of Transportation (Non-Medical):   Physical Activity:   . Days of Exercise per Week:   . Minutes of Exercise per Session:   Stress:   . Feeling of Stress :   Social Connections:   . Frequency of Communication with Friends and Family:   . Frequency of Social Gatherings with Friends and Family:   . Attends Religious Services:   . Active Member of Clubs or Organizations:   . Attends Archivist Meetings:   Marland Kitchen Marital Status:   Intimate Partner Violence:   .  Fear of Current or Ex-Partner:   . Emotionally Abused:   Marland Kitchen Physically Abused:   . Sexually Abused:     Review of Systems Per HPI  Objective:   Vitals:   10/12/19 0805  BP: 139/83  Pulse: 63  Temp: (!) 97.3 F (36.3 C)  TempSrc: Temporal  SpO2: 98%  Weight: 207 lb (93.9 kg)  Height: '5\' 4"'  (1.626 m)     Physical Exam Vitals reviewed.  Constitutional:      General: She is not in acute distress.    Appearance: She is well-developed.  HENT:     Head: Normocephalic and atraumatic.  Cardiovascular:     Rate and Rhythm: Normal rate.  Pulmonary:     Effort: Pulmonary effort is normal.  Musculoskeletal:     Left knee: Swelling (prominence of lower anterior knee soft tissue with possible trace effucion. skin intact, no erythema. ), bony tenderness (medial joint line. ) and crepitus present. No erythema or ecchymosis. Normal range of motion. No LCL laxity, MCL laxity, ACL laxity or PCL laxity.Abnormal meniscus (min posterior knee pain with mcmurray. ). Normal patellar mobility. Normal pulse.     Instability Tests: Negative medial McMurray test and negative lateral McMurray test.     Left lower leg: No swelling (neg homans). No edema.  Neurological:     Mental Status: She is alert and oriented to person, place, and time.        Assessment & Plan:  Stephanie Carter is a 74 y.o. female . Left knee pain, unspecified chronicity - Plan: DG Knee Complete 4 Views Left, Ambulatory referral to Orthopedic Surgery  -Possible degenerative meniscal disease but does note some fullness in the anterior lower aspect of knee and posterior pain.  Check x-ray, refer to Ortho.  Tylenol, rare NSAID for symptomatic care if needed.  Encounter for screening for malignant neoplasm of breast, unspecified screening modality - Plan: MM Digital Screening   No orders of the defined types were placed in this encounter.  Patient Instructions    I will place the order for mammogram for upcoming  appointment.  Intermittent knee pain could be related to meniscus issues.  I will let you know if there are concerns on x-ray but will refer you to orthopedics to decide on possible injection or other treatment.  Episodic Tylenol is probably safer than NSAIDs such as ibuprofen or Aleve.Return to the clinic or go to the nearest emergency room if any of your symptoms worsen or new symptoms occur.   Chronic Knee Pain, Adult Chronic knee pain is pain in one or both knees that lasts longer than 3 months.  Symptoms of chronic knee pain may include swelling, stiffness, and discomfort. Age-related wear and tear (osteoarthritis) of the knee joint is the most common cause of chronic knee pain. Other possible causes include:  A long-term immune-related disease that causes inflammation of the knee (rheumatoid arthritis). This usually affects both knees.  Inflammatory arthritis, such as gout or pseudogout.  An injury to the knee that causes arthritis.  An injury to the knee that damages the ligaments. Ligaments are strong tissues that connect bones to each other.  Runner's knee or pain behind the kneecap. Treatment for chronic knee pain depends on the cause. The main treatments for chronic knee pain are physical therapy and weight loss. This condition may also be treated with medicines, injections, a knee sleeve or brace, and by using crutches. Rest, ice, compression (pressure), and elevation (RICE) therapy may also be recommended. Follow these instructions at home: If you have a knee sleeve or brace:   Wear it as told by your health care provider. Remove it only as told by your health care provider.  Loosen it if your toes tingle, become numb, or turn cold and blue.  Keep it clean.  If the sleeve or brace is not waterproof: ? Do not let it get wet. ? Remove it if allowed by your health care provider, or cover it with a watertight covering when you take a bath or a shower. Managing pain, stiffness,  and swelling      If directed, apply heat to the affected area as often as told by your health care provider. Use the heat source that your health care provider recommends, such as a moist heat pack or a heating pad. ? If you have a removable sleeve or brace, remove it as told by your health care provider. ? Place a towel between your skin and the heat source. ? Leave the heat on for 20-30 minutes. ? Remove the heat if your skin turns bright red. This is especially important if you are unable to feel pain, heat, or cold. You may have a greater risk of getting burned.  If directed, put ice on the affected area. ? If you have a removable sleeve or brace, remove it as told by your health care provider. ? Put ice in a plastic bag. ? Place a towel between your skin and the bag. ? Leave the ice on for 20 minutes, 2-3 times a day.  Move your toes often to reduce stiffness and swelling.  Raise (elevate) the injured area above the level of your heart while you are sitting or lying down. Activity  Avoid activities where both feet leave the ground at the same time (high-impact activities). Examples are running, jumping rope, and doing jumping jacks.  Return to your normal activities as told by your health care provider. Ask your health care provider what activities are safe for you.  Follow the exercise plan that your health care provider designed for you. Your health care provider may suggest that you: ? Avoid activities that make knee pain worse. This may require you to change your exercise routines, sport participation, or job duties. ? Wear shoes with cushioned soles. ? Avoid high-impact activities or sports that require running and sudden changes in direction. ? Do physical therapy as told by your health care provider. Physical therapy is planned to match your needs and abilities. It may include exercises for strength, flexibility, stability, and endurance. ? Do exercises that increase  balance and strength, such as tai chi  and yoga.  Do not use the injured limb to support your body weight until your health care provider says that you can. Use crutches, a cane, or a walker, as told by your health care provider. General instructions  Take over-the-counter and prescription medicines only as told by your health care provider.  Lose weight if you are overweight. Losing even a little weight can reduce knee pain. Ask your health care provider what your ideal weight is, and how to safely lose extra weight. A food expert (dietitian) may be able to help you plan your meals.  Do not use any products that contain nicotine or tobacco, such as cigarettes, e-cigarettes, and chewing tobacco. These can delay healing. If you need help quitting, ask your health care provider.  Keep all follow-up visits as told by your health care provider. This is important. Contact a health care provider if:  You have knee pain that is not getting better or gets worse.  You are unable to do your physical therapy exercises due to knee pain. Get help right away if:  Your knee swells and the swelling becomes worse.  You cannot move your knee.  You have severe knee pain. Summary  Knee pain that lasts more than 3 months is considered chronic knee pain.  The main treatments for chronic knee pain are physical therapy and weight loss. You may also need to take medicines, wear a knee sleeve or brace, use crutches, and apply ice or heat.  Losing even a little weight can reduce knee pain. Ask your health care provider what your ideal weight is, and how to safely lose extra weight. A food expert (dietitian) may be able to help you plan your meals.  Work with a physical therapist to make a safe exercise program, as told by your health care provider. This information is not intended to replace advice given to you by your health care provider. Make sure you discuss any questions you have with your health care  provider. Document Revised: 09/04/2018 Document Reviewed: 09/04/2018 Elsevier Patient Education  [employer].    If you have lab work done today you will be contacted with your lab results within the next 2 weeks.  If you have not heard from [place] then please contact us. The fastest way to get your results is to register for My Chart.   IF you received an x-ray today, you will receive an invoice from [facility]. Please contact [facility] at [phone] with questions or concerns regarding your invoice.   IF you received labwork today, you will receive an invoice from [facility]. Please contact [facility] at [phone] with questions or concerns regarding your invoice.   Our billing staff will not be able to assist you with questions regarding bills from these companies.  You will be contacted with the lab results as soon as they are available. The fastest way to get your results is to activate your My Chart account. Instructions are located on the last page of this paperwork. If you have not heard from [place] regarding the results in 2 weeks, please contact this office.         Signed, [name], MD [facility]

## 2019-10-12 NOTE — Addendum Note (Signed)
Addended by: [name] R on: 10/12/2019 08:49 AM   Modules accepted: Orders

## 2019-10-13 ENCOUNTER — Telehealth: Payer: Self-pay

## 2019-10-26 ENCOUNTER — Ambulatory Visit: Payer: Medicare Other | Admitting: Orthopaedic Surgery

## 2019-10-26 ENCOUNTER — Other Ambulatory Visit: Payer: Self-pay

## 2019-10-26 VITALS — Ht 64.0 in | Wt 207.0 lb

## 2019-10-26 DIAGNOSIS — G8929 Other chronic pain: Secondary | ICD-10-CM | POA: Diagnosis not present

## 2019-10-26 DIAGNOSIS — M25562 Pain in left knee: Secondary | ICD-10-CM

## 2019-10-26 MED ORDER — LIDOCAINE HCL 1 % IJ SOLN
3.0000 mL | INTRAMUSCULAR | Status: AC | PRN
  Administered 2019-10-26: 3 mL

## 2019-10-26 MED ORDER — METHYLPREDNISOLONE ACETATE 40 MG/ML IJ SUSP
40.0000 mg | INTRAMUSCULAR | Status: AC | PRN
  Administered 2019-10-26: 40 mg via INTRA_ARTICULAR

## 2019-10-26 NOTE — Progress Notes (Signed)
Office Visit Note   Patient: Stephanie Carter           Date of Birth: 1945/08/17           MRN: [id number] Visit Date: 10/26/2019              Requested by: Wendie Agreste, MD [address] [place],  [place] [address] PCP: Wendie Agreste, MD   Assessment & Plan: Visit Diagnoses:  1. Chronic pain of left knee     Plan: I do feel that the patient would benefit from physical therapy on her left knee to strengthen the knee.  She has no signs or symptoms of instability so I do not feel that she needs a knee brace.  I did recommend a steroid injection to see if that would help with her pain.  I was only able to aspirate about 10 cc of fluid from the knee which was clear.  I did place a steroid injection in the knee after explaining the risk and benefits of steroid injections.  She tolerated it well.  All questions and concerns were answered and addressed.  We will see her back in 4 weeks to see how she is doing overall.  She can participate in her water aerobics later this morning.  Follow-Up Instructions: Return in about 4 weeks (around 11/23/2019).   Orders:  No orders of the defined types were placed in this encounter.  No orders of the defined types were placed in this encounter.     Procedures: Large Joint Inj: L knee on 10/26/2019 9:29 AM Indications: diagnostic evaluation and pain Details: 22 G 1.5 in needle, superolateral approach  Arthrogram: No  Medications: 3 mL lidocaine 1 %; 40 mg methylPREDNISolone acetate 40 MG/ML Outcome: tolerated well, no immediate complications Procedure, treatment alternatives, risks and benefits explained, specific risks discussed. Consent was given by the patient. Immediately prior to procedure a time out was called to verify the correct patient, procedure, equipment, support staff and site/side marked as required. Patient was prepped and draped in the usual sterile fashion.       Clinical Data: No additional  findings.   Subjective: Chief Complaint  Patient presents with  . Left Knee - Pain  The patient is a very pleasant and active 74 year old female has been dealing with left knee pain is been slowly worsening for 2 years now.  She does report swelling in that knee.  She has never had surgery on the knee.  She says she walks a lot and the knee is always swollen at this point.  She states that she has good range of motion of the knee.  She is never had surgery on the left knee.  She says it certainly bothers her going up and down stairs and when she has to get on her knee for communion.  She denies any acute changes in her medical status.  She is not a diabetic.  HPI  Review of Systems She currently denies any headache, chest pain, shortness of breath, fever, chills, nausea, vomiting  Objective: Vital Signs: Ht 5\' 4"  (1.626 m)   Wt 207 lb (93.9 kg)   BMI 35.53 kg/m   Physical Exam She is alert and orient x3 and in no acute distress Ortho Exam Examination of her left and right knees together show her left knee does have valgus malalignment.  There is a slight effusion of the left knee.  There is global tenderness but good tracking of the patella.  The knee feels ligamentously stable on exam.  She does also walk with her knee in a valgus position.  Her calf is soft.  Her hip exam is normal. Specialty Comments:  No specialty comments available.  Imaging: No results found. X-rays independently reviewed of the left knee show only some mild patellofemoral arthritic changes.  There is mild valgus malalignment but the medial lateral compartment joint spaces are well-maintained.  PMFS History: Patient Active Problem List   Diagnosis Date Noted  . Psoriasis 11/05/2013  . Smoking 11/05/2013  . Lipid disorder 11/09/2011  . Hypothyroid 11/09/2011   Past Medical History:  Diagnosis Date  . Allergy   . Thyroid disease     Family History  Problem Relation Age of Onset  . Stroke Father      Past Surgical History:  Procedure Laterality Date  . ABDOMINAL HYSTERECTOMY     Social History   Occupational History  . Occupation: wait person  Tobacco Use  . Smoking status: Former Smoker    Packs/day: 1.00    Years: 50.00    Pack years: 50.00    Types: Cigarettes    Quit date: 07/03/2015    Years since quitting: 4.3  . Smokeless tobacco: Never Used  Substance and Sexual Activity  . Alcohol use: Yes    Alcohol/week: 0.0 standard drinks  . Drug use: No  . Sexual activity: Never

## 2019-10-30 ENCOUNTER — Ambulatory Visit: Payer: Medicare Other | Admitting: Rehabilitative and Restorative Service Providers"

## 2019-10-30 ENCOUNTER — Encounter: Payer: Self-pay | Admitting: Rehabilitative and Restorative Service Providers"

## 2019-10-30 ENCOUNTER — Other Ambulatory Visit: Payer: Self-pay

## 2019-10-30 DIAGNOSIS — R262 Difficulty in walking, not elsewhere classified: Secondary | ICD-10-CM

## 2019-10-30 DIAGNOSIS — G8929 Other chronic pain: Secondary | ICD-10-CM

## 2019-10-30 DIAGNOSIS — M6281 Muscle weakness (generalized): Secondary | ICD-10-CM | POA: Diagnosis not present

## 2019-10-30 DIAGNOSIS — M25562 Pain in left knee: Secondary | ICD-10-CM

## 2019-10-30 NOTE — Patient Instructions (Signed)
Access Code: [email or web address] URL: [email or web address] Date: 10/30/2019 Prepared by: Scot Jun  Exercises Supine Heel Slide - 2 x daily - 7 x weekly - 3 sets - 10 reps - 2 hold Small Range Straight Leg Raise - 2 x daily - 7 x weekly - 10 reps - 3 sets Sidelying Hip Abduction - 2 x daily - 7 x weekly - 3 sets - 10 reps Sit to Stand - 1 x daily - 7 x weekly - 10 reps - 3 sets

## 2019-10-30 NOTE — Therapy (Signed)
[facility] Physical Therapy [address] [place], [place], [address] Phone: [phone]   Fax:  [phone]  Physical Therapy Evaluation  Patient Details  Name: Stephanie Carter MRN: [id number] Date of Birth: April 26, 1946 Referring Provider (PT): Dr. Ninfa Linden   Encounter Date: 10/30/2019  PT End of Session - 10/30/19 1439    Visit Number  1    Number of Visits  8    Date for PT Re-Evaluation  11/27/19    Authorization Time Period  cert [id number] - [id number]    Progress Note Due on Visit  10    PT Start Time  1400    PT Stop Time  1437    PT Time Calculation (min)  37 min       Past Medical History:  Diagnosis Date  . Allergy   . Thyroid disease     Past Surgical History:  Procedure Laterality Date  . ABDOMINAL HYSTERECTOMY      There were no vitals filed for this visit.   Subjective Assessment - 10/30/19 1407    Subjective  Pt. indicated she isn't sure of what is going on.  Pt. stated having complaints in Lt knee and some swelling as well.  Pt. indicated xrays were performed and indicated no signicant changes noted on xray.  Pt. stated injection on Monday but wasn't sure if it helped yet or not.  Pt. stated feeling pain at night on lateral knee and anterior knee.   Pt. indicated onset about 3 years ago with insidious up/down.   Pt. stated having history of previous falls but more than 6 months ago.  Pt. stated landing on Lt knee in previous falls and unsure if that is related.    Currently in Pain?  Yes    Pain Score  3    at worst 9/10, pain at 3/4 range typically.   Pain Location  Knee    Pain Orientation  Left    Pain Descriptors / Indicators  Throbbing;Aching;Sharp    Pain Type  Chronic pain    Pain Onset  More than a month ago    Pain Frequency  Intermittent    Aggravating Factors   pivoting/turning at times (sharp pain), stairs, prolonged standing/walking    Pain Relieving Factors  change positions.    Effect of Pain on Daily Activities   Limiting in cooking, ymca activity limited at times.         [facility] PT Assessment - 10/30/19 0001      Assessment   Medical Diagnosis  Chronic Lt knee pain    Referring Provider (PT)  Dr. Ninfa Linden    Hand Dominance  Right    Next MD Visit  11/23/2019      Precautions   Precautions  None      Restrictions   Weight Bearing Restrictions  No      Balance Screen   Has the patient fallen in the past 6 months  No    Is the patient reluctant to leave their home because of a fear of falling?   No      Home Film/video editor residence    Home Access  Stairs to enter    Entrance Stairs-Number of Steps  1    [place]  One level      Prior Function   Level of Independence  Independent    Leisure  cooking/standing, ymca activity      Cognition   Overall Cognitive Status  Within Functional Limits for tasks assessed      Sensation   Light Touch  Appears Intact      Functional Tests   Functional tests  Single leg stance      Single Leg Stance   Comments  Rt SLS 12 seconds c mild postural sway/aberrant movement.  Lt SLS 10 seconds c moderate postural sway/aberrant movement      ROM / Strength   AROM / PROM / Strength  Strength;PROM;AROM      AROM   Overall AROM Comments  Mild tightness reported on lateral/posterior knee c Lt knee flexion active    AROM Assessment Site  Knee    Right/Left Knee  Left;Right    Right Knee Extension  0    Right Knee Flexion  130    Left Knee Extension  0    Left Knee Flexion  125      PROM   PROM Assessment Site  Knee    Right/Left Knee  Left;Right    Left Knee Flexion  125      Strength   Strength Assessment Site  Knee;Hip;Ankle    Right/Left Hip  Left;Right    Right Hip Flexion  5/5    Right Hip ABduction  4+/5    Left Hip Flexion  5/5    Left Hip ABduction  3+/5    Right/Left Knee  Left;Right    Right Knee Flexion  5/5    Right Knee Extension  5/5    Left Knee Flexion  5/5    Left Knee Extension  4+/5     Right/Left Ankle  Left;Right    Right Ankle Dorsiflexion  5/5    Left Ankle Dorsiflexion  5/5      Palpation   Patella mobility  mild limitation superior/inferior on Lt knee    Palpation comment  No specific tenderness in Lt knee at this time to touch      Transfers   Comments  Able to perform 18 inch chair sit to stand s UE assist.       Ambulation/Gait   Gait Comments  Independent ambulation, unremarkable at self selected walking speed on even surface in clinic                Objective measurements completed on examination: See above findings.      [facility] PT Treatment/Exercise - 10/30/19 0001      Exercises   Exercises  Knee/Hip      Knee/Hip Exercises: Standing   Other Standing Knee Exercises  retro step 30 x bilateral      Knee/Hip Exercises: Seated   Sit to Sand  without UE support   3 x 10     Knee/Hip Exercises: Supine   Other Supine Knee/Hip Exercises  supine heel slide to SLR 3 x 10 bilateral      Knee/Hip Exercises: Sidelying   Hip ABduction  3 sets;10 reps;Left;Right      Manual Therapy   Manual therapy comments  passive ROM c distraction, IR,flexion of Lt knee             PT Education - 10/30/19 1446    Education Details  HEP, POC    Person(s) Educated  Patient    Methods  Explanation;Verbal cues    Comprehension  Verbalized understanding;Returned demonstration          PT Long Term Goals - 10/30/19 1443      PT LONG TERM GOAL #1   Title  Patient will demonstrate/report pain at worst less than or equal to 2/10 to facilitate minimal limitation in daily activity secondary to pain symptoms.    Time  4    Period  Weeks    Status  New    Target Date  11/27/19      PT LONG TERM GOAL #2   Title  Patient will demonstrate independent use of home exercise program to facilitate ability to maintain/progress functional gains from skilled physical therapy services.    Time  4    Period  Weeks    Status  New    Target Date  11/27/19       PT LONG TERM GOAL #3   Title  Patient will demonstrate return to work/recreational activity at previous level of function without limitations secondary due to condition    Time  4    Period  Weeks    Status  New    Target Date  11/27/19      PT LONG TERM GOAL #4   Title  Patient will demonstrate bilateral LE MMT 5/5 throughout to facilitate ability to perform usual standing, walking, stairs at PLOF s limitation due to symptoms.    Time  4    Period  Weeks    Status  New    Target Date  11/27/19      PT LONG TERM GOAL #5   Title  Pt. will demonstrate Lt knee AROM equal to Rt s symptoms to faciitate usual standing, squat, transfers at PLOF s limitation.    Time  4    Period  Weeks    Status  New    Target Date  11/27/19      Additional Long Term Goals   Additional Long Term Goals  Yes      PT LONG TERM GOAL #6   Title  Pt. will demonstrate bilateral SLS > 15 seconds s aberrant movement to facilitate improved stability in stance in ambulation.    Time  4    Period  Weeks    Status  New    Target Date  11/27/19             Plan - 10/30/19 1400    Clinical Impression Statement  PatientNichole Carter) is a 74 y.o. female who comes to clinic with complaints of Lt knee pain with mobility, strength and movement coordination deficits that impair her ability to perform usual daily and recreational functional activities without increase difficulty/symptoms at this time.  Patient to benefit from skilled PT services to address impairments and limitations to improve to previous level of function without restriction secondary to condition.    Examination-Activity Limitations  Locomotion Level;Transfers;Stand;Stairs;Squat    Examination-Participation Restrictions  Meal Prep;Community Activity    Stability/Clinical Decision Making  Stable/Uncomplicated    Clinical Decision Making  Low    Rehab Potential  Good    PT Frequency  2x / week    PT Duration  4 weeks    PT  Treatment/Interventions  Taping;Vasopneumatic Device;Spinal Manipulations;Joint Manipulations;Dry needling;Passive range of motion;Patient/family education;Manual techniques;Neuromuscular re-education;Balance training;Therapeutic exercise;Therapeutic activities;Functional mobility training;Stair training;Gait training;Iontophoresis 4mg /ml Dexamethasone;Moist Heat;Traction;Ultrasound;Electrical Stimulation;ADLs/Self Care Home Management    PT Next Visit Plan  Review HEP, continue to address impairment    PT Home Exercise Plan  [id number]    Consulted and Agree with Plan of Care  Patient       Patient will benefit from skilled therapeutic intervention in order to improve the following deficits and impairments:  Decreased coordination, Decreased range of motion, Difficulty walking, Pain, Decreased balance, Decreased strength, Decreased mobility  Visit Diagnosis: Chronic pain of left knee - Plan: PT plan of care cert/re-cert  Muscle weakness (generalized) - Plan: PT plan of care cert/re-cert  Difficulty in walking, not elsewhere classified - Plan: PT plan of care cert/re-cert     Problem List Patient Active Problem List   Diagnosis Date Noted  . Psoriasis 11/05/2013  . Smoking 11/05/2013  . Lipid disorder 11/09/2011  . Hypothyroid 11/09/2011    Scot Jun, PT, DPT, OCS, ATC 10/30/19  2:48 PM    [facility] [address] [place], [place], [address] Phone: [phone]   Fax:  [phone]  Name: Stephanie Carter MRN: [id number] Date of Birth: Feb 07, 1946

## 2019-11-06 ENCOUNTER — Other Ambulatory Visit: Payer: Self-pay

## 2019-11-06 ENCOUNTER — Ambulatory Visit: Payer: Medicare Other | Admitting: Rehabilitative and Restorative Service Providers"

## 2019-11-06 ENCOUNTER — Encounter: Payer: Self-pay | Admitting: Rehabilitative and Restorative Service Providers"

## 2019-11-06 DIAGNOSIS — R262 Difficulty in walking, not elsewhere classified: Secondary | ICD-10-CM

## 2019-11-06 DIAGNOSIS — M25562 Pain in left knee: Secondary | ICD-10-CM | POA: Diagnosis not present

## 2019-11-06 DIAGNOSIS — G8929 Other chronic pain: Secondary | ICD-10-CM

## 2019-11-06 DIAGNOSIS — M6281 Muscle weakness (generalized): Secondary | ICD-10-CM | POA: Diagnosis not present

## 2019-11-06 NOTE — Therapy (Signed)
[facility] Physical Therapy [address] [place], [place], [address] Phone: [phone]   Fax:  [phone]  Physical Therapy Treatment  Patient Details  Name: Stephanie Carter MRN: [id number] Date of Birth: 09-21-1945 Referring Provider (PT): Dr. Ninfa Linden   Encounter Date: 11/06/2019  PT End of Session - 11/06/19 1444    Visit Number  2    Number of Visits  8    Date for PT Re-Evaluation  11/27/19    Authorization Time Period  cert [id number] - 0000000    Progress Note Due on Visit  10    PT Start Time  1441    PT Stop Time  1520    PT Time Calculation (min)  39 min    Activity Tolerance  Patient tolerated treatment well    Behavior During Therapy  [facility] for tasks assessed/performed       Past Medical History:  Diagnosis Date  . Allergy   . Thyroid disease     Past Surgical History:  Procedure Laterality Date  . ABDOMINAL HYSTERECTOMY      There were no vitals filed for this visit.  Subjective Assessment - 11/06/19 1442    Subjective  Pt. indicated pain about 3/10 or so today.  Pt. stated she did some walking and pool activity today prior to appointment.  Pt. indicated feeling maybe some slight improvements to this point.    Currently in Pain?  Yes    Pain Score  3     Pain Location  Knee    Pain Orientation  Left    Pain Descriptors / Indicators  Throbbing;Aching;Sharp    Pain Type  Chronic pain    Pain Onset  More than a month ago    Pain Frequency  Intermittent    Aggravating Factors   pivoting, stairs                       [facility] Adult PT Treatment/Exercise - 11/06/19 0001      Knee/Hip Exercises: Stretches   Gastroc Stretch  Both   5 x 15 seconds bilateral incline board     Knee/Hip Exercises: Aerobic   Nustep  Lvl 6 10 mins      Knee/Hip Exercises: Standing   Lateral Step Up  Both;2 sets;10 reps   4 inch step   Other Standing Knee Exercises  retro step 30 x bilateral      Knee/Hip Exercises: Seated   Long Arc  Quad  Left   3 x 10   Long Arc Quad Weight  7 lbs.      Knee/Hip Exercises: Supine   Other Supine Knee/Hip Exercises  supine heel slide and SLR x 10 Lt LE      Knee/Hip Exercises: Sidelying   Hip ABduction  Left   x 10     Manual Therapy   Manual therapy comments  passive ROM c distraction, IR,flexion of Lt knee                  PT Long Term Goals - 10/30/19 1443      PT LONG TERM GOAL #1   Title  Patient will demonstrate/report pain at worst less than or equal to 2/10 to facilitate minimal limitation in daily activity secondary to pain symptoms.    Time  4    Period  Weeks    Status  New    Target Date  11/27/19      PT LONG TERM GOAL #  2   Title  Patient will demonstrate independent use of home exercise program to facilitate ability to maintain/progress functional gains from skilled physical therapy services.    Time  4    Period  Weeks    Status  New    Target Date  11/27/19      PT LONG TERM GOAL #3   Title  Patient will demonstrate return to work/recreational activity at previous level of function without limitations secondary due to condition    Time  4    Period  Weeks    Status  New    Target Date  11/27/19      PT LONG TERM GOAL #4   Title  Patient will demonstrate bilateral LE MMT 5/5 throughout to facilitate ability to perform usual standing, walking, stairs at PLOF s limitation due to symptoms.    Time  4    Period  Weeks    Status  New    Target Date  11/27/19      PT LONG TERM GOAL #5   Title  Pt. will demonstrate Lt knee AROM equal to Rt s symptoms to faciitate usual standing, squat, transfers at PLOF s limitation.    Time  4    Period  Weeks    Status  New    Target Date  11/27/19      Additional Long Term Goals   Additional Long Term Goals  Yes      PT LONG TERM GOAL #6   Title  Pt. will demonstrate bilateral SLS > 15 seconds s aberrant movement to facilitate improved stability in stance in ambulation.    Time  4    Period  Weeks     Status  New    Target Date  11/27/19            Plan - 11/06/19 1444    Clinical Impression Statement  Fair recall of HEP at this time c some cues for adjustment required.  Indications of necessity for WB quad strengthening/control for eccentric control in step and squat activity to improve biomechanics and offloading of knee jt pain.  Continued skilled PT services indicated at this time.    Examination-Activity Limitations  Locomotion Level;Transfers;Stand;Stairs;Squat    Examination-Participation Restrictions  Meal Prep;Community Activity    Stability/Clinical Decision Making  Stable/Uncomplicated    Rehab Potential  Good    PT Frequency  2x / week    PT Duration  4 weeks    PT Treatment/Interventions  Taping;Vasopneumatic Device;Spinal Manipulations;Joint Manipulations;Dry needling;Passive range of motion;Patient/family education;Manual techniques;Neuromuscular re-education;Balance training;Therapeutic exercise;Therapeutic activities;Functional mobility training;Stair training;Gait training;Iontophoresis 4mg /ml Dexamethasone;Moist Heat;Traction;Ultrasound;Electrical Stimulation;ADLs/Self Care Home Management    PT Next Visit Plan  progress strength training in WB.    PT Home Exercise Plan  [id number]    Consulted and Agree with Plan of Care  Patient       Patient will benefit from skilled therapeutic intervention in order to improve the following deficits and impairments:  Decreased coordination, Decreased range of motion, Difficulty walking, Pain, Decreased balance, Decreased strength, Decreased mobility  Visit Diagnosis: Chronic pain of left knee  Muscle weakness (generalized)  Difficulty in walking, not elsewhere classified     Problem List Patient Active Problem List   Diagnosis Date Noted  . Psoriasis 11/05/2013  . Smoking 11/05/2013  . Lipid disorder 11/09/2011  . Hypothyroid 11/09/2011   Scot Jun, PT, DPT, OCS, ATC 11/06/19  3:24 PM    [facility]  [address] [place], [place], [address] Phone: [phone]   Fax:  [phone]  Name: Stephanie Carter MRN: [id number] Date of Birth: [birth year]

## 2019-11-12 ENCOUNTER — Ambulatory Visit: Payer: Medicare Other | Admitting: Rehabilitative and Restorative Service Providers"

## 2019-11-12 ENCOUNTER — Encounter: Payer: Self-pay | Admitting: Rehabilitative and Restorative Service Providers"

## 2019-11-12 ENCOUNTER — Other Ambulatory Visit: Payer: Self-pay

## 2019-11-12 DIAGNOSIS — M6281 Muscle weakness (generalized): Secondary | ICD-10-CM | POA: Diagnosis not present

## 2019-11-12 DIAGNOSIS — R262 Difficulty in walking, not elsewhere classified: Secondary | ICD-10-CM

## 2019-11-12 DIAGNOSIS — G8929 Other chronic pain: Secondary | ICD-10-CM

## 2019-11-12 DIAGNOSIS — M25562 Pain in left knee: Secondary | ICD-10-CM

## 2019-11-12 NOTE — Therapy (Signed)
[facility] [address] [place], [place], [address] Phone: [phone]   Fax:  [phone]  Physical Therapy Treatment  Patient Details  Name: POSIE HANSE MRN: [id number] Date of Birth: 09-28-1945 Referring Provider (PT): Dr. Ninfa Linden   Encounter Date: 11/12/2019  PT End of Session - 11/12/19 0929    Visit Number  3    Number of Visits  8    Date for PT Re-Evaluation  11/27/19    Authorization Time Period  cert [id number] - 0000000    Progress Note Due on Visit  10    PT Start Time  0932    PT Stop Time  1012    PT Time Calculation (min)  40 min    Activity Tolerance  Patient tolerated treatment well    Behavior During Therapy  [facility] for tasks assessed/performed       Past Medical History:  Diagnosis Date  . Allergy   . Thyroid disease     Past Surgical History:  Procedure Laterality Date  . ABDOMINAL HYSTERECTOMY      There were no vitals filed for this visit.  Subjective Assessment - 11/12/19 0930    Subjective  Pt. stated feeling no pain at rest.  Pt. stated stairs upon arrival today hurt at 4/10 or so but kept going up the steps.    Currently in Pain?  Yes    Pain Score  4     Pain Location  Knee    Pain Orientation  Left    Pain Descriptors / Indicators  Throbbing    Pain Type  Chronic pain    Pain Onset  More than a month ago    Pain Frequency  Occasional    Aggravating Factors   stairs    Pain Relieving Factors  not doing steps                       [facility] Adult PT Treatment/Exercise - 11/12/19 0001      Neuro Re-ed    Neuro Re-ed Details   tandem stance 30 sec x 4 bilateral      Knee/Hip Exercises: Stretches   Gastroc Stretch  Both;5 reps;30 seconds      Knee/Hip Exercises: Aerobic   Recumbent Bike  lvl 3 10 mins      Knee/Hip Exercises: Standing   Lateral Step Up  Both;2 sets;10 reps;Step Height: 4"    Forward Step Up  Both;2 sets;10 reps   TKE blue band     Knee/Hip Exercises: Seated    Long Arc Quad  Left;2 sets;10 reps    Long Arc Quad Weight  8 lbs.             PT Education - 11/12/19 0940    Education Details  Intervention cues    Person(s) Educated  Patient    Methods  Explanation;Demonstration;Verbal cues    Comprehension  Verbalized understanding;Returned demonstration          PT Long Term Goals - 11/12/19 0940      PT LONG TERM GOAL #1   Title  Patient will demonstrate/report pain at worst less than or equal to 2/10 to facilitate minimal limitation in daily activity secondary to pain symptoms.    Time  4    Period  Weeks    Status  On-going    Target Date  11/27/19      PT LONG TERM GOAL #2   Title  Patient will demonstrate  independent use of home exercise program to facilitate ability to maintain/progress functional gains from skilled physical therapy services.    Time  4    Period  Weeks    Status  On-going    Target Date  11/27/19      PT LONG TERM GOAL #3   Title  Patient will demonstrate return to work/recreational activity at previous level of function without limitations secondary due to condition    Time  4    Period  Weeks    Status  On-going    Target Date  11/27/19      PT LONG TERM GOAL #4   Title  Patient will demonstrate bilateral LE MMT 5/5 throughout to facilitate ability to perform usual standing, walking, stairs at PLOF s limitation due to symptoms.    Time  4    Period  Weeks    Status  On-going    Target Date  11/27/19      PT LONG TERM GOAL #5   Title  Pt. will demonstrate Lt knee AROM equal to Rt s symptoms to faciitate usual standing, squat, transfers at PLOF s limitation.    Time  4    Period  Weeks    Status  On-going    Target Date  11/27/19      PT LONG TERM GOAL #6   Title  Pt. will demonstrate bilateral SLS > 15 seconds s aberrant movement to facilitate improved stability in stance in ambulation.    Time  4    Period  Weeks    Status  On-going    Target Date  11/27/19            Plan -  11/12/19 1000    Clinical Impression Statement  Improved tolerance to WB activity today.  Eccentric lowering control on step continued to show deficits compared to norms and Rt LE.  Continued strength training and movement coordination indicated at this time.    Examination-Activity Limitations  Locomotion Level;Transfers;Stand;Stairs;Squat    Examination-Participation Restrictions  Meal Prep;Community Activity    Stability/Clinical Decision Making  Stable/Uncomplicated    Rehab Potential  Good    PT Frequency  2x / week    PT Duration  4 weeks    PT Treatment/Interventions  Taping;Vasopneumatic Device;Spinal Manipulations;Joint Manipulations;Dry needling;Passive range of motion;Patient/family education;Manual techniques;Neuromuscular re-education;Balance training;Therapeutic exercise;Therapeutic activities;Functional mobility training;Stair training;Gait training;Iontophoresis 4mg /ml Dexamethasone;Moist Heat;Traction;Ultrasound;Electrical Stimulation;ADLs/Self Care Home Management    PT Next Visit Plan  Strengthening, movement coordination    PT Home Exercise Plan  [id number]    Consulted and Agree with Plan of Care  Patient       Patient will benefit from skilled therapeutic intervention in order to improve the following deficits and impairments:  Decreased coordination, Decreased range of motion, Difficulty walking, Pain, Decreased balance, Decreased strength, Decreased mobility  Visit Diagnosis: Chronic pain of left knee  Muscle weakness (generalized)  Difficulty in walking, not elsewhere classified     Problem List Patient Active Problem List   Diagnosis Date Noted  . Psoriasis 11/05/2013  . Smoking 11/05/2013  . Lipid disorder 11/09/2011  . Hypothyroid 11/09/2011    Scot Jun, PT, DPT, OCS, ATC 11/12/19  10:13 AM    [facility] [address] [place], [place], [address] Phone: [phone]   Fax:  [phone]  Name: EMMASOPHIA SCIOSCIA MRN: [id number] Date of Birth: 12-11-1945

## 2019-11-16 NOTE — Telephone Encounter (Signed)
No ation required at this time, closing encounter from inbox 

## 2019-11-20 ENCOUNTER — Encounter: Payer: Self-pay | Admitting: Rehabilitative and Restorative Service Providers"

## 2019-11-20 ENCOUNTER — Other Ambulatory Visit: Payer: Self-pay

## 2019-11-20 ENCOUNTER — Ambulatory Visit: Payer: Medicare Other | Admitting: Rehabilitative and Restorative Service Providers"

## 2019-11-20 DIAGNOSIS — G8929 Other chronic pain: Secondary | ICD-10-CM | POA: Diagnosis not present

## 2019-11-20 DIAGNOSIS — M6281 Muscle weakness (generalized): Secondary | ICD-10-CM | POA: Diagnosis not present

## 2019-11-20 DIAGNOSIS — M25562 Pain in left knee: Secondary | ICD-10-CM | POA: Diagnosis not present

## 2019-11-20 DIAGNOSIS — R262 Difficulty in walking, not elsewhere classified: Secondary | ICD-10-CM

## 2019-11-20 NOTE — Therapy (Addendum)
[facility] Physical Therapy [address] [place], [place], [address] Phone: [phone]   Fax:  [phone]  Physical Therapy Treatment/Discharge  Patient Details  Name: Stephanie Carter MRN: [id number] Date of Birth: 09-Feb-1946 Referring Provider (PT): Dr. Ninfa Linden   Encounter Date: 11/20/2019  PT End of Session - 11/20/19 1435    Visit Number  4    Number of Visits  8    Date for PT Re-Evaluation  11/27/19    Authorization Time Period  cert 1/66/0600 - 10/12/9975    Progress Note Due on Visit  10    PT Start Time  1348    PT Stop Time  1432    PT Time Calculation (min)  44 min    Activity Tolerance  Patient tolerated treatment well    Behavior During Therapy  [facility] for tasks assessed/performed       Past Medical History:  Diagnosis Date  . Allergy   . Thyroid disease     Past Surgical History:  Procedure Laterality Date  . ABDOMINAL HYSTERECTOMY      There were no vitals filed for this visit.  Subjective Assessment - 11/20/19 1348    Subjective  Stephanie Carter notes stairs are improving.  They are easier with less pain.    Currently in Pain?  No/denies    Pain Onset  More than a month ago                        [facility] Adult PT Treatment/Exercise - 11/20/19 0001      Knee/Hip Exercises: Aerobic   Recumbent Bike  lvl 3 10 mins      Knee/Hip Exercises: Machines for Strengthening   Total Gym Leg Press  2 sets of 10 at 100#      Knee/Hip Exercises: Standing   Forward Step Up  2 sets;10 reps   Slow eccentrics     Knee/Hip Exercises: Seated   Long Arc Quad  3 sets;5 reps   Seated with slow eccentrics   Sit to Sand  2 sets;5 reps             PT Education - 11/20/19 1434    Education Details  Reviewed HEP.  Emphasized quadriceps strengthening and slow eccentrics.    Person(s) Educated  Patient    Methods  Explanation;Demonstration;Tactile cues;Verbal cues;Handout    Comprehension  Verbalized understanding;Tactile cues  required;Returned demonstration;Need further instruction;Verbal cues required          PT Long Term Goals - 11/12/19 0940      PT LONG TERM GOAL #1   Title  Patient will demonstrate/report pain at worst less than or equal to 2/10 to facilitate minimal limitation in daily activity secondary to pain symptoms.    Time  4    Period  Weeks    Status  On-going    Target Date  11/27/19      PT LONG TERM GOAL #2   Title  Patient will demonstrate independent use of home exercise program to facilitate ability to maintain/progress functional gains from skilled physical therapy services.    Time  4    Period  Weeks    Status  On-going    Target Date  11/27/19      PT LONG TERM GOAL #3   Title  Patient will demonstrate return to work/recreational activity at previous level of function without limitations secondary due to condition    Time  4    Period  Weeks    Status  On-going    Target Date  11/27/19      PT LONG TERM GOAL #4   Title  Patient will demonstrate bilateral LE MMT 5/5 throughout to facilitate ability to perform usual standing, walking, stairs at PLOF s limitation due to symptoms.    Time  4    Period  Weeks    Status  On-going    Target Date  11/27/19      PT LONG TERM GOAL #5   Title  Pt. will demonstrate Lt knee AROM equal to Rt s symptoms to faciitate usual standing, squat, transfers at PLOF s limitation.    Time  4    Period  Weeks    Status  On-going    Target Date  11/27/19      PT LONG TERM GOAL #6   Title  Pt. will demonstrate bilateral SLS > 15 seconds s aberrant movement to facilitate improved stability in stance in ambulation.    Time  4    Period  Weeks    Status  On-going    Target Date  11/27/19            Plan - 11/20/19 1435    Clinical Impression Statement  Stephanie Carter notes tremendous progress with her ability to do stairs.  Her quadriceps will continue to benefit from consistent HEP.  Continued strength and endurance work will also assist her  with her walking endurance and with her desire to walk better.    Examination-Activity Limitations  Locomotion Level;Transfers;Stand;Stairs;Squat    Examination-Participation Restrictions  Meal Prep;Community Activity    Stability/Clinical Decision Making  Stable/Uncomplicated    Rehab Potential  Good    PT Frequency  2x / week    PT Duration  4 weeks    PT Treatment/Interventions  Taping;Vasopneumatic Device;Spinal Manipulations;Joint Manipulations;Dry needling;Passive range of motion;Patient/family education;Manual techniques;Neuromuscular re-education;Balance training;Therapeutic exercise;Therapeutic activities;Functional mobility training;Stair training;Gait training;Iontophoresis 51m/ml Dexamethasone;Moist Heat;Traction;Ultrasound;Electrical Stimulation;ADLs/Self Care Home Management    PT Next Visit Plan  Strengthening, movement coordination    PT Home Exercise Plan  [email or web address]    Consulted and Agree with Plan of Care  Patient       Patient will benefit from skilled therapeutic intervention in order to improve the following deficits and impairments:  Decreased coordination, Decreased range of motion, Difficulty walking, Pain, Decreased balance, Decreased strength, Decreased mobility  Visit Diagnosis: Chronic pain of left knee  Difficulty in walking, not elsewhere classified  Muscle weakness (generalized)     Problem List Patient Active Problem List   Diagnosis Date Noted  . Psoriasis 11/05/2013  . Smoking 11/05/2013  . Lipid disorder 11/09/2011  . Hypothyroid 11/09/2011    RFarley LyPT, MPT 11/20/2019, 4:56 PM   PHYSICAL THERAPY DISCHARGE SUMMARY  Visits from Start of Care: 4  Current functional level related to goals / functional outcomes: See note   Remaining deficits: See note   Education / Equipment: HEP  Plan: Patient agrees to discharge.  Patient goals were partially met. Patient is being discharged due to being pleased with the current functional level.   ?????    MScot Jun PT, DPT, OCS, ATC 12/09/19  10:13 AM     [facility] [address] [address]Phone: [phone]  Fax:  [phone] Name: Stephanie DUBINSKYMRN: [id number] of Birth: [birth year]

## 2019-11-20 NOTE — Patient Instructions (Signed)
Access Code: [email or web address] URL: [email or web address] Date: 11/20/2019 Prepared by: Vista Mink  Exercises Small Range Straight Leg Raise - 1 x daily - 7 x weekly - 6 sets - 5 reps - 3 secondsinutes hold

## 2019-11-23 ENCOUNTER — Ambulatory Visit: Payer: Medicare Other | Admitting: Orthopaedic Surgery

## 2019-11-23 ENCOUNTER — Encounter: Payer: Self-pay | Admitting: Orthopaedic Surgery

## 2019-11-23 ENCOUNTER — Other Ambulatory Visit: Payer: Self-pay

## 2019-11-23 DIAGNOSIS — G8929 Other chronic pain: Secondary | ICD-10-CM

## 2019-11-23 DIAGNOSIS — M25562 Pain in left knee: Secondary | ICD-10-CM | POA: Diagnosis not present

## 2019-11-23 NOTE — Progress Notes (Signed)
The patient is following up after having a steroid injection to left knee.  She has been going to physical therapy to help adjust her gait and balance in general.  She states she is not really having any pain in either knee.  She points the pes bursa on both knees more so on the left than the right.  She also has some numbness and tingling around her right IT band.  She has been going to physical therapy.  She walks about a mile a day cannot do much more than that.  She is really mostly concerned just about her walking in general and her trouble walking.  When I see her walk there is definitely a difference in her gait.  When I isolate all muscle groups of her bilateral extremities she has excellent strength in her muscle groups.  She does have pain over the pes bursa at both knees but not the knee joint itself.  From my standpoint I think a home exercise program that physical therapy can design for her would be helpful.  There is no other recommendations I have for her.  I do feel the Voltaren gel would be helpful for her pes bursitis and over the lateral IT band on her right side.  All questions and concerns were answered and addressed.  Follow-up can be as needed.

## 2019-12-11 ENCOUNTER — Encounter: Payer: Medicare Other | Admitting: Physical Therapy

## 2019-12-18 ENCOUNTER — Encounter: Payer: Medicare Other | Admitting: Physical Therapy

## 2020-06-23 ENCOUNTER — Telehealth: Payer: Self-pay | Admitting: Family Medicine

## 2020-06-23 DIAGNOSIS — E039 Hypothyroidism, unspecified: Secondary | ICD-10-CM

## 2020-06-23 MED ORDER — LEVOTHYROXINE SODIUM 75 MCG PO TABS: ORAL_TABLET | ORAL | 3 refills | Status: DC

## 2020-06-23 NOTE — Telephone Encounter (Signed)
  Patient scheduled for cpe in March. Will need refill for levothyroxine (SYNTHROID) 75 MCG tablet [[id number]]   Before end of December. Patient now uses [facility] pharmacy [place]. Please advise at [phone]

## 2020-06-23 NOTE — Telephone Encounter (Signed)
Med refill sent in pt will need to keep appt in March for future refills.

## 2020-08-24 ENCOUNTER — Other Ambulatory Visit: Payer: Self-pay | Admitting: Family Medicine

## 2020-08-24 DIAGNOSIS — H65193 Other acute nonsuppurative otitis media, bilateral: Secondary | ICD-10-CM

## 2020-08-24 DIAGNOSIS — E785 Hyperlipidemia, unspecified: Secondary | ICD-10-CM

## 2020-08-24 MED ORDER — ATORVASTATIN CALCIUM 20 MG PO TABS: ORAL_TABLET | ORAL | 3 refills | Status: DC

## 2020-08-24 MED ORDER — FLUTICASONE PROPIONATE 50 MCG/ACT NA SUSP: 2.0000 | Freq: Every day | NASAL | 11 refills | Status: DC

## 2020-08-24 NOTE — Telephone Encounter (Signed)
Requested medication (s) are due for refill today: Yes  Requested medication (s) are on the active medication list: Yes  Last refill:  06/2019  Future visit scheduled: Yes  Notes to clinic:  Unable to refill per protocol, Rx expired.      Requested Prescriptions  Pending Prescriptions Disp Refills   atorvastatin (LIPITOR) 20 MG tablet 90 tablet 3    Sig: TAKE 1 TABLET(20 MG) BY MOUTH DAILY      Cardiovascular:  Antilipid - Statins Failed - 08/24/2020 12:13 PM      Failed - Total Cholesterol in normal range and within 360 days    Cholesterol, Total  Date Value Ref Range Status  06/10/2019 180 100 - 199 mg/dL Final          Failed - LDL in normal range and within 360 days    LDL Chol Calc (NIH)  Date Value Ref Range Status  06/10/2019 102 (H) 0 - 99 mg/dL Final          Failed - HDL in normal range and within 360 days    HDL  Date Value Ref Range Status  06/10/2019 62 >39 mg/dL Final          Failed - Triglycerides in normal range and within 360 days    Triglycerides  Date Value Ref Range Status  06/10/2019 89 0 - 149 mg/dL Final          Passed - Patient is not pregnant      Passed - Valid encounter within last 12 months    Recent Outpatient Visits           10 months ago Left knee pain, unspecified chronicity   [facility], Ranell Patrick, MD   1 year ago Medicare annual wellness visit, subsequent   [facility], Ranell Patrick, MD   2 years ago Medicare annual wellness visit, subsequent   [facility], Ranell Patrick, MD   3 years ago Left knee pain, unspecified chronicity   [facility], Ranell Patrick, MD   3 years ago Left knee pain, unspecified chronicity   [facility], Ranell Patrick, MD       Future Appointments             In 3 weeks Wendie Agreste, MD [facility] [place], [facility]               fluticasone [facility]) 50 MCG/ACT nasal spray 16 g 11    Sig: Place 2 sprays  into both nostrils daily.      Ear, Nose, and Throat: Nasal Preparations - Corticosteroids Passed - 08/24/2020 12:13 PM      Passed - Valid encounter within last 12 months    Recent Outpatient Visits           10 months ago Left knee pain, unspecified chronicity   [facility], Ranell Patrick, MD   1 year ago Medicare annual wellness visit, subsequent   [facility], Ranell Patrick, MD   2 years ago Medicare annual wellness visit, subsequent   [facility], Ranell Patrick, MD   3 years ago Left knee pain, unspecified chronicity   [facility], Ranell Patrick, MD   3 years ago Left knee pain, unspecified chronicity   [facility], Ranell Patrick, MD       Future Appointments  In 3 weeks Wendie Agreste, MD [facility] [place], [facility]

## 2020-08-24 NOTE — Telephone Encounter (Signed)
atorvastatin (LIPITOR) 20 MG tablet fluticasone (FLONASE) 50 MCG/ACT nasal spray [  Pt is completely out of her nasal spray, almost out of her atorvastatin.   Medication Refill - Medication:   Has the patient contacted their pharmacy? No. (Agent: If no, request that the patient contact the pharmacy for the refill.) (Agent: If yes, when and what did the pharmacy advise?)  Preferred Pharmacy (with phone number or street name):  [facility] [address] [place]  [place] [address]  Phone: [phone] Fax: [phone]     Agent: Please be advised that RX refills may take up to 3 business days. We ask that you follow-up with your pharmacy.

## 2020-09-16 ENCOUNTER — Other Ambulatory Visit: Payer: Self-pay

## 2020-09-16 ENCOUNTER — Ambulatory Visit (INDEPENDENT_AMBULATORY_CARE_PROVIDER_SITE_OTHER): Payer: Medicare Other | Admitting: Family Medicine

## 2020-09-16 ENCOUNTER — Encounter: Payer: Self-pay | Admitting: Family Medicine

## 2020-09-16 VITALS — BP 140/66 | HR 62 | Temp 98.4°F | Ht 64.0 in | Wt 204.0 lb

## 2020-09-16 DIAGNOSIS — E785 Hyperlipidemia, unspecified: Secondary | ICD-10-CM | POA: Diagnosis not present

## 2020-09-16 DIAGNOSIS — E039 Hypothyroidism, unspecified: Secondary | ICD-10-CM

## 2020-09-16 DIAGNOSIS — Z0000 Encounter for general adult medical examination without abnormal findings: Secondary | ICD-10-CM

## 2020-09-16 DIAGNOSIS — Z0001 Encounter for general adult medical examination with abnormal findings: Secondary | ICD-10-CM | POA: Diagnosis not present

## 2020-09-16 DIAGNOSIS — M8580 Other specified disorders of bone density and structure, unspecified site: Secondary | ICD-10-CM | POA: Diagnosis not present

## 2020-09-16 MED ORDER — LEVOTHYROXINE SODIUM 75 MCG PO TABS: ORAL_TABLET | ORAL | 3 refills | Status: DC

## 2020-09-16 NOTE — Progress Notes (Signed)
Subjective:  Patient ID: Stephanie Carter, female    DOB: 1946/03/14  Age: 75 y.o. MRN: [id number]  CC:  Chief Complaint  Patient presents with  . Medicare Wellness    PT reports she feels well with no complaints. Pt reports no issues with with current medication that she has noticed.     HPI Stephanie Carter presents for   Annual wellness exam   Care team Primary care provider, [facility], Dr. Ninfa Linden for chronic pain of left knee, has undergone physical therapy  Allergic rhinitis Flonase as needed only - works well.   Hyperlipidemia: Lipitor 20 mg QOD. No new myalgias or side effects.  Lab Results  Component Value Date   CHOL 180 06/10/2019   HDL 62 06/10/2019   LDLCALC 102 (H) 06/10/2019   TRIG 89 06/10/2019   CHOLHDL 2.9 06/10/2019   Lab Results  Component Value Date   ALT 18 06/10/2019   AST 23 06/10/2019   ALKPHOS 59 06/10/2019   BILITOT 0.7 06/10/2019   Hypothyroidism: Lab Results  Component Value Date   TSH 1.360 06/10/2019  Taking medication daily.  Synthroid 75 mcg No new hot or cold intolerance. No new hair or skin changes, heart palpitations or new fatigue. No new weight changes.  Stable tsh for may years, no recent dose chnges  Cancer screening: Colonoscopy 12/31/2017 with repeat in 3 to 5 years Mammogram -2017, normal Bone density: DEXA scan in December 2018 with osteopenia.  Takes calcium and vitamin D but no recent testing. Agrees to repeat.   Immunization History  Administered Date(s) Administered  . Fluad Quad(high Dose 65+) 06/10/2019  . Influenza Split 06/12/2012, 03/25/2013  . Influenza, High Dose Seasonal PF 05/01/2018  . Influenza-Unspecified 06/10/2016, 04/23/2017, 08/01/2020  . Pneumococcal Conjugate-13 12/22/2015  . Pneumococcal Polysaccharide-23 02/12/2017  . Pneumococcal-Unspecified 07/09/2009  . Tdap 07/09/2009  shingles vaccine - has not had - not planning on it.   Fall Risk  09/16/2020 10/12/2019 06/10/2019 05/01/2018  03/14/2017  Falls in the past year? 1 0 0 No No  Number falls in past yr: 0 - 0 - -  Injury with Fall? 1 - 0 - -  Follow up Falls evaluation completed Falls evaluation completed Falls evaluation completed - -  Fall few days ago while vacuuming - tripped on cord. No injuries - fell onto left knee - min soreness. No residual deficit. Able to walk/exercise  Lighting in the home: adequate Stairs: none Loose rugs - 1 in living room - covered Grab bars in bathroom - does have.   Depression screen [facility] 2/9 09/16/2020 10/12/2019 06/10/2019 05/01/2018 03/14/2017  Decreased Interest 0 0 0 0 0  Down, Depressed, Hopeless 0 0 0 0 0  PHQ - 2 Score 0 0 0 0 0   Functional Status Survey: Is the patient deaf or have difficulty hearing?: No Does the patient have difficulty seeing, even when wearing glasses/contacts?: No Does the patient have difficulty concentrating, remembering, or making decisions?: No Does the patient have difficulty walking or climbing stairs?: No Does the patient have difficulty dressing or bathing?: No Does the patient have difficulty doing errands alone such as visiting a doctor's office or shopping?: No 6CIT Screen 09/16/2020 06/10/2019 05/01/2018  What Year? 0 points 0 points 0 points  What month? 0 points 0 points 0 points  What time? 0 points 0 points 0 points  Count back from 20 0 points 0 points 0 points  Months in reverse 0 points 0 points 0  points  Repeat phrase 0 points 0 points 0 points  Total Score 0 0 0    Flowsheet Row Office Visit from 09/16/2020 in [facility] at [facility]  AUDIT-C Score 1    No tobacco/IDU  No exam data present Ophthalmology/optometry  - last visit few years ago - plans to schedule.   Dentist - every 6 months.  Exercise - frequent walking, water exercises. Swimming.   Advanced directives -has living will and healthcare power of attorney   History Patient Active Problem List   Diagnosis Date Noted  . Psoriasis 11/05/2013  . Smoking 11/05/2013   . Lipid disorder 11/09/2011  . Hypothyroid 11/09/2011   Past Medical History:  Diagnosis Date  . Allergy   . Thyroid disease    Past Surgical History:  Procedure Laterality Date  . ABDOMINAL HYSTERECTOMY     No Known Allergies Prior to Admission medications   Medication Sig Start Date End Date Taking? Authorizing Provider  atorvastatin (LIPITOR) 20 MG tablet TAKE 1 TABLET(20 MG) BY MOUTH DAILY 08/24/20  Yes Wendie Agreste, MD  Calcium Carb-Cholecalciferol (CALCIUM + D3 PO) Take by mouth.   Yes [provider]  calcium gluconate 500 MG tablet Take 1 tablet by mouth 3 (three) times daily.   Yes [provider]  fluticasone (FLONASE) 50 MCG/ACT nasal spray Place 2 sprays into both nostrils daily. 08/24/20  Yes Wendie Agreste, MD  levothyroxine (SYNTHROID) 75 MCG tablet TAKE 1 TABLET(75 MCG) BY MOUTH DAILY 06/23/20  Yes Wendie Agreste, MD  Multiple Vitamin (MULTIVITAMIN) tablet Take 1 tablet by mouth daily.   Yes [provider]   Social History   Socioeconomic History  . Marital status: Single    Spouse name: Not on file  . Number of children: Not on file  . Years of education: Not on file  . Highest education level: Not on file  Occupational History  . Occupation: wait person  Tobacco Use  . Smoking status: Former Smoker    Packs/day: 1.00    Years: 50.00    Pack years: 50.00    Types: Cigarettes    Quit date: 07/03/2015    Years since quitting: 5.2  . Smokeless tobacco: Never Used  Vaping Use  . Vaping Use: Never used  Substance and Sexual Activity  . Alcohol use: Yes    Alcohol/week: 1.0 standard drink    Types: 1 Glasses of wine per week  . Drug use: No  . Sexual activity: Yes  Other Topics Concern  . 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  Negative other than as indicated above or in HPI.   Objective:   Vitals:   09/16/20 1450  BP: 140/66  Pulse: 62  Temp: 98.4 F (36.9 C)  TempSrc: Temporal  SpO2: 95%  Weight: 204 lb (92.5 kg)  Height: 5\' 4"  (1.626 m)     Physical Exam Constitutional:      Appearance: She is well-developed.  HENT:     Head: Normocephalic and atraumatic.  Right Ear: External ear normal.     Left Ear: External ear normal.     Ears:     Comments: Clear fluid base left tm. Min cerumen non obstructive - bilaterally.  Eyes:     Conjunctiva/sclera: Conjunctivae normal.     Pupils: Pupils are equal, round, and reactive to light.  Neck:     Thyroid: No thyromegaly.  Cardiovascular:     Rate and Rhythm: Normal rate and regular rhythm.     Heart sounds: Normal heart sounds. No murmur heard.   Pulmonary:     Effort: Pulmonary effort is normal. No respiratory distress.     Breath sounds: Normal breath sounds. No wheezing.  Abdominal:     General: Bowel sounds are normal.     Palpations: Abdomen is soft.     Tenderness: There is no abdominal tenderness.  Musculoskeletal:        General: No tenderness. Normal range of motion.     Cervical back: Normal range of motion and neck supple.  Lymphadenopathy:     Cervical: No cervical adenopathy.  Skin:    General: Skin is warm and dry.     Findings: No rash.  Neurological:     Mental Status: She is alert and oriented to person, place, and time.  Psychiatric:        Behavior: Behavior normal.        Thought Content: Thought content normal.     Assessment & Plan:  Stephanie Carter is a 75 y.o. female . Medicare annual wellness visit, subsequent  - - anticipatory guidance as below in AVS, screening labs if needed. Health  maintenance items as above in HPI discussed/recommended as applicable.  - no concerning responses on depression, fall, or functional status screening. Any positive responses noted as above. Advanced directives discussed as in CHL.   Hyperlipidemia, unspecified hyperlipidemia type - Plan: Comprehensive metabolic panel, Lipid panel  -Tolerating Lipitor, continue same.  Check labs.  Recheck 1 year  Hypothyroidism, unspecified type - Plan: TSH, levothyroxine (SYNTHROID) 75 MCG tablet  -New symptoms, TSH has been stable for some time on current regimen.  Continue same.  Yearly recheck unless new symptoms.  Osteopenia, unspecified location - Plan: DG Bone Density  -Continue calcium, vitamin D, updated bone density ordered.    Meds ordered this encounter  Medications  . levothyroxine (SYNTHROID) 75 MCG tablet    Sig: TAKE 1 TABLET(75 MCG) BY MOUTH DAILY    Dispense:  90 tablet    Refill:  3    **Patient requests 90 days supply**   Patient Instructions   Try restarting Flonase to help with nasal congestion which may also help with the fluid behind the ears.  If any ear pain or change in hearing be seen right away.  No medication changes at this time.  If any new symptoms I do recommend more frequent testing of thyroid  but can follow-up in 1 year if symptoms are stable. No medication changes at this time. Let me know if there are questions.  Health Maintenance After Age 78 After age 25, you are at a higher risk for certain long-term diseases and infections as well as injuries from falls. Falls are a major cause of broken bones and head injuries in people who are older than age 5. Getting regular preventive care can help to keep you healthy and well. Preventive care includes getting regular testing and making lifestyle changes as recommended by your health care provider. Talk with your  health care provider about:  Which screenings and tests you should have. A screening is a test that checks  for a disease when you have no symptoms.  A diet and exercise plan that is right for you. What should I know about screenings and tests to prevent falls? Screening and testing are the best ways to find a health problem early. Early diagnosis and treatment give you the best chance of managing medical conditions that are common after age 2. Certain conditions and lifestyle choices may make you more likely to have a fall. Your health care provider may recommend:  Regular vision checks. Poor vision and conditions such as cataracts can make you more likely to have a fall. If you wear glasses, make sure to get your prescription updated if your vision changes.  Medicine review. Work with your health care provider to regularly review all of the medicines you are taking, including over-the-counter medicines. Ask your health care provider about any side effects that may make you more likely to have a fall. Tell your health care provider if any medicines that you take make you feel dizzy or sleepy.  Osteoporosis screening. Osteoporosis is a condition that causes the bones to get weaker. This can make the bones weak and cause them to break more easily.  Blood pressure screening. Blood pressure changes and medicines to control blood pressure can make you feel dizzy.  Strength and balance checks. Your health care provider may recommend certain tests to check your strength and balance while standing, walking, or changing positions.  Foot health exam. Foot pain and numbness, as well as not wearing proper footwear, can make you more likely to have a fall.  Depression screening. You may be more likely to have a fall if you have a fear of falling, feel emotionally low, or feel unable to do activities that you used to do.  Alcohol use screening. Using too much alcohol can affect your balance and may make you more likely to have a fall. What actions can I take to lower my risk of falls? General  instructions  Talk with your health care provider about your risks for falling. Tell your health care provider if: ? You fall. Be sure to tell your health care provider about all falls, even ones that seem minor. ? You feel dizzy, sleepy, or off-balance.  Take over-the-counter and prescription medicines only as told by your health care provider. These include any supplements.  Eat a healthy diet and maintain a healthy weight. A healthy diet includes low-fat dairy products, low-fat (lean) meats, and fiber from whole grains, beans, and lots of fruits and vegetables. Home safety  Remove any tripping hazards, such as rugs, cords, and clutter.  Install safety equipment such as grab bars in bathrooms and safety rails on stairs.  Keep rooms and walkways well-lit. Activity  Follow a regular exercise program to stay fit. This will help you maintain your balance. Ask your health care provider what types of exercise are appropriate for you.  If you need a cane or walker, use it as recommended by your health care provider.  Wear supportive shoes that have nonskid soles.   Lifestyle  Do not drink alcohol if your health care provider tells you not to drink.  If you drink alcohol, limit how much you have: ? 0-1 drink a day for women. ? 0-2 drinks a day for men.  Be aware of how much alcohol is in your drink. In the [place] one drink equals  one typical bottle of beer (12 oz), one-half glass of wine (5 oz), or one shot of hard liquor (1 oz).  Do not use any products that contain nicotine or tobacco, such as cigarettes and e-cigarettes. If you need help quitting, ask your health care provider. Summary  Having a healthy lifestyle and getting preventive care can help to protect your health and wellness after age 22.  Screening and testing are the best way to find a health problem early and help you avoid having a fall. Early diagnosis and treatment give you the best chance for managing medical  conditions that are more common for people who are older than age 45.  Falls are a major cause of broken bones and head injuries in people who are older than age 29. Take precautions to prevent a fall at home.  Work with your health care provider to learn what changes you can make to improve your health and wellness and to prevent falls. This information is not intended to replace advice given to you by your health care provider. Make sure you discuss any questions you have with your health care provider. Document Revised: 10/16/2018 Document Reviewed: 05/08/2017 Elsevier Patient Education  2021 [employer].    If you have lab work done today you will be contacted with your lab results within the next 2 weeks.  If you have not heard from [place] then please contact us. The fastest way to get your results is to register for My Chart.   IF you received an x-ray today, you will receive an invoice from [facility]. Please contact [facility] at [phone] with questions or concerns regarding your invoice.   IF you received labwork today, you will receive an invoice from [facility]. Please contact [facility] at [phone] with questions or concerns regarding your invoice.   Our billing staff will not be able to assist you with questions regarding bills from these companies.  You will be contacted with the lab results as soon as they are available. The fastest way to get your results is to activate your My Chart account. Instructions are located on the last page of this paperwork. If you have not heard from [place] regarding the results in 2 weeks, please contact this office.         Signed, [name], MD [facility]

## 2020-09-16 NOTE — Patient Instructions (Addendum)
Try restarting Flonase to help with nasal congestion which may also help with the fluid behind the ears.  If any ear pain or change in hearing be seen right away.  No medication changes at this time.  If any new symptoms I do recommend more frequent testing of thyroid  but can follow-up in 1 year if symptoms are stable. No medication changes at this time. Let me know if there are questions.  Health Maintenance After Age 76 After age 1, you are at a higher risk for certain long-term diseases and infections as well as injuries from falls. Falls are a major cause of broken bones and head injuries in people who are older than age 3. Getting regular preventive care can help to keep you healthy and well. Preventive care includes getting regular testing and making lifestyle changes as recommended by your health care provider. Talk with your health care provider about:  Which screenings and tests you should have. A screening is a test that checks for a disease when you have no symptoms.  A diet and exercise plan that is right for you. What should I know about screenings and tests to prevent falls? Screening and testing are the best ways to find a health problem early. Early diagnosis and treatment give you the best chance of managing medical conditions that are common after age 52. Certain conditions and lifestyle choices may make you more likely to have a fall. Your health care provider may recommend:  Regular vision checks. Poor vision and conditions such as cataracts can make you more likely to have a fall. If you wear glasses, make sure to get your prescription updated if your vision changes.  Medicine review. Work with your health care provider to regularly review all of the medicines you are taking, including over-the-counter medicines. Ask your health care provider about any side effects that may make you more likely to have a fall. Tell your health care provider if any medicines that you take make  you feel dizzy or sleepy.  Osteoporosis screening. Osteoporosis is a condition that causes the bones to get weaker. This can make the bones weak and cause them to break more easily.  Blood pressure screening. Blood pressure changes and medicines to control blood pressure can make you feel dizzy.  Strength and balance checks. Your health care provider may recommend certain tests to check your strength and balance while standing, walking, or changing positions.  Foot health exam. Foot pain and numbness, as well as not wearing proper footwear, can make you more likely to have a fall.  Depression screening. You may be more likely to have a fall if you have a fear of falling, feel emotionally low, or feel unable to do activities that you used to do.  Alcohol use screening. Using too much alcohol can affect your balance and may make you more likely to have a fall. What actions can I take to lower my risk of falls? General instructions  Talk with your health care provider about your risks for falling. Tell your health care provider if: ? You fall. Be sure to tell your health care provider about all falls, even ones that seem minor. ? You feel dizzy, sleepy, or off-balance.  Take over-the-counter and prescription medicines only as told by your health care provider. These include any supplements.  Eat a healthy diet and maintain a healthy weight. A healthy diet includes low-fat dairy products, low-fat (lean) meats, and fiber from whole grains, beans, and lots  of fruits and vegetables. Home safety  Remove any tripping hazards, such as rugs, cords, and clutter.  Install safety equipment such as grab bars in bathrooms and safety rails on stairs.  Keep rooms and walkways well-lit. Activity  Follow a regular exercise program to stay fit. This will help you maintain your balance. Ask your health care provider what types of exercise are appropriate for you.  If you need a cane or walker, use it as  recommended by your health care provider.  Wear supportive shoes that have nonskid soles.   Lifestyle  Do not drink alcohol if your health care provider tells you not to drink.  If you drink alcohol, limit how much you have: ? 0-1 drink a day for women. ? 0-2 drinks a day for men.  Be aware of how much alcohol is in your drink. In the [place] one drink equals one typical bottle of beer (12 oz), one-half glass of wine (5 oz), or one shot of hard liquor (1 oz).  Do not use any products that contain nicotine or tobacco, such as cigarettes and e-cigarettes. If you need help quitting, ask your health care provider. Summary  Having a healthy lifestyle and getting preventive care can help to protect your health and wellness after age 62.  Screening and testing are the best way to find a health problem early and help you avoid having a fall. Early diagnosis and treatment give you the best chance for managing medical conditions that are more common for people who are older than age 67.  Falls are a major cause of broken bones and head injuries in people who are older than age 47. Take precautions to prevent a fall at home.  Work with your health care provider to learn what changes you can make to improve your health and wellness and to prevent falls. This information is not intended to replace advice given to you by your health care provider. Make sure you discuss any questions you have with your health care provider. Document Revised: 10/16/2018 Document Reviewed: 05/08/2017 Elsevier Patient Education  2021 [employer].    If you have lab work done today you will be contacted with your lab results within the next 2 weeks.  If you have not heard from [place] then please contact us. The fastest way to get your results is to register for My Chart.   IF you received an x-ray today, you will receive an invoice from [facility]. Please contact [facility] at [phone] with  questions or concerns regarding your invoice.   IF you received labwork today, you will receive an invoice from [place]. Please contact [facility] at [phone] with questions or concerns regarding your invoice.   Our billing staff will not be able to assist you with questions regarding bills from these companies.  You will be contacted with the lab results as soon as they are available. The fastest way to get your results is to activate your My Chart account. Instructions are located on the last page of this paperwork. If you have not heard from [place] regarding the results in 2 weeks, please contact this office.

## 2020-09-17 LAB — LIPID PANEL
Chol/HDL Ratio: 2.7 ratio (ref 0.0–4.4)
Cholesterol, Total: 173 mg/dL (ref 100–199)
HDL: 64 mg/dL (ref >39)
LDL Chol Calc (NIH): 92 mg/dL (ref 0–99)
Triglycerides: 95 mg/dL (ref 0–149)
VLDL Cholesterol Cal: 17 mg/dL (ref 5–40)

## 2020-09-17 LAB — COMPREHENSIVE METABOLIC PANEL
ALT: 13 IU/L (ref 0–32)
AST: 21 IU/L (ref 0–40)
Albumin/Globulin Ratio: 1.6 (ref 1.2–2.2)
Albumin: 4.4 g/dL (ref 3.7–4.7)
Alkaline Phosphatase: 60 IU/L (ref 44–121)
BUN/Creatinine Ratio: 28 (ref 12–28)
BUN: 20 mg/dL (ref 8–27)
Bilirubin Total: 0.6 mg/dL (ref 0.0–1.2)
CO2: 24 mmol/L (ref 20–29)
Calcium: 9.7 mg/dL (ref 8.7–10.3)
Chloride: 101 mmol/L (ref 96–106)
Creatinine, Ser: 0.71 mg/dL (ref 0.57–1.00)
Globulin, Total: 2.7 g/dL (ref 1.5–4.5)
Glucose: 87 mg/dL (ref 65–99)
Potassium: 4 mmol/L (ref 3.5–5.2)
Sodium: 140 mmol/L (ref 134–144)
Total Protein: 7.1 g/dL (ref 6.0–8.5)
eGFR: 88 mL/min/{1.73_m2} (ref >59)

## 2020-09-17 LAB — TSH: TSH: 1.41 u[IU]/mL (ref 0.450–4.500)

## 2020-10-24 IMAGING — DX DG KNEE COMPLETE 4+V*L*
4 series · 4 of 4 positions shown · non-contrast
Comparison: 03/14/2017

CLINICAL DATA: Left knee pain, no known injury, initial encounter

EXAM:
LEFT KNEE - COMPLETE 4+ VIEW

[knee ap]
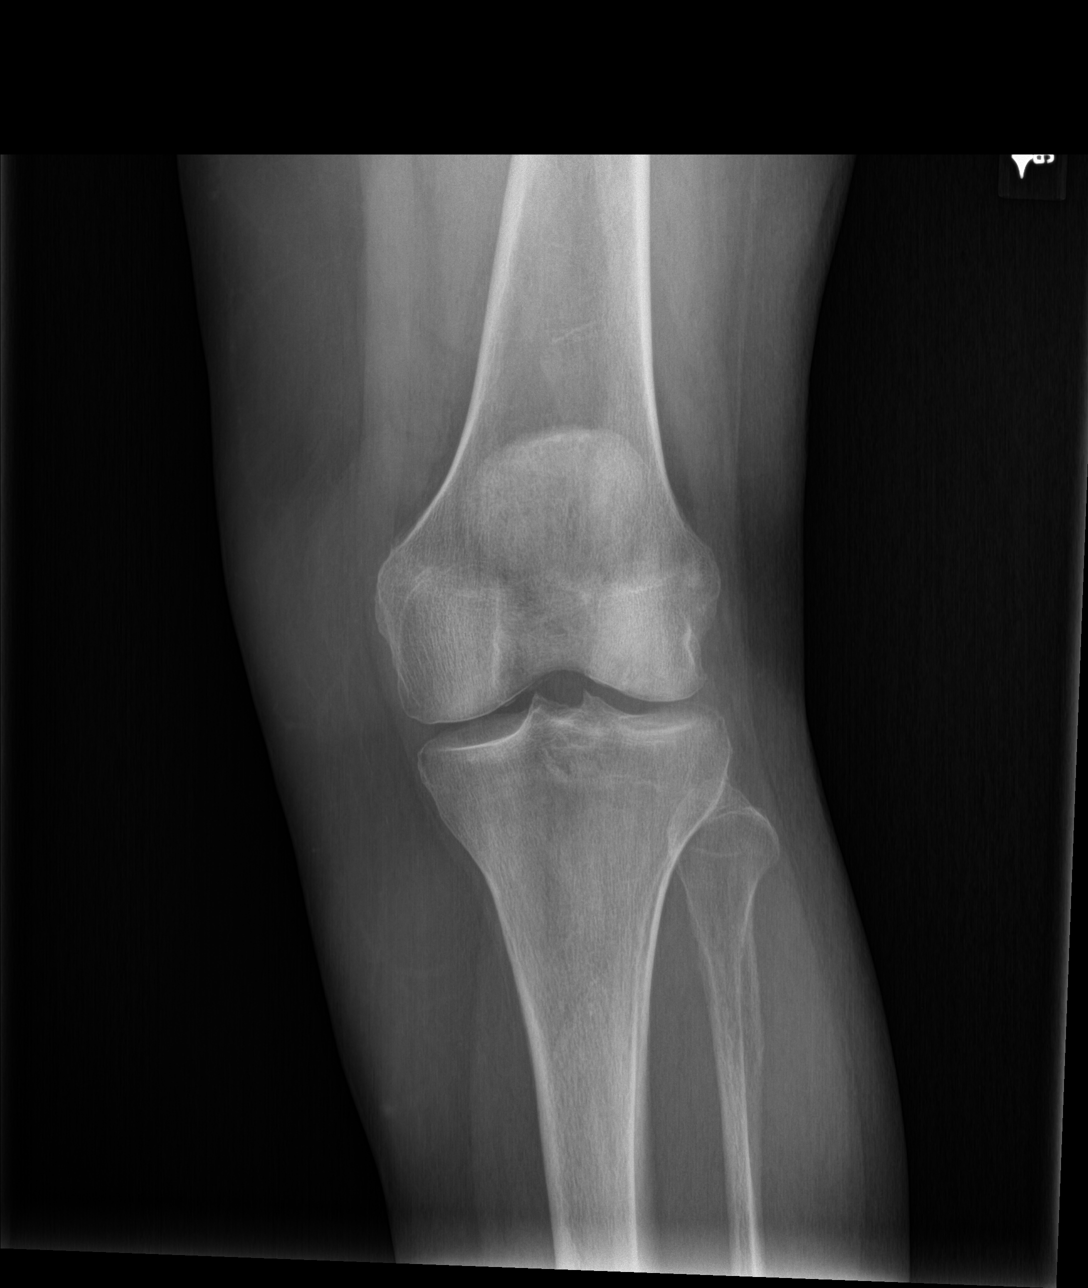

[sunrise]
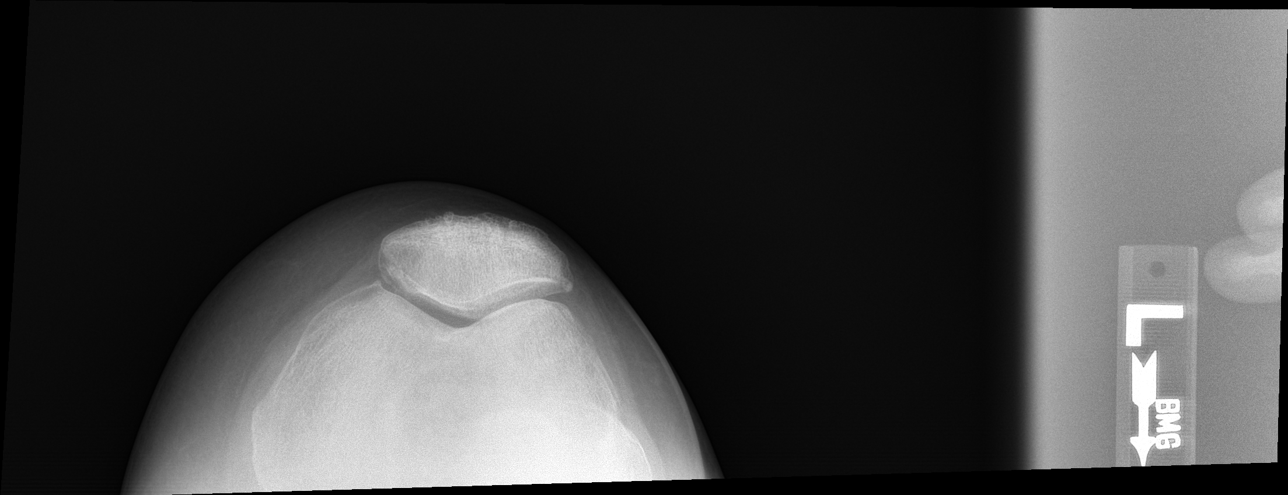

[knee [person_name]]
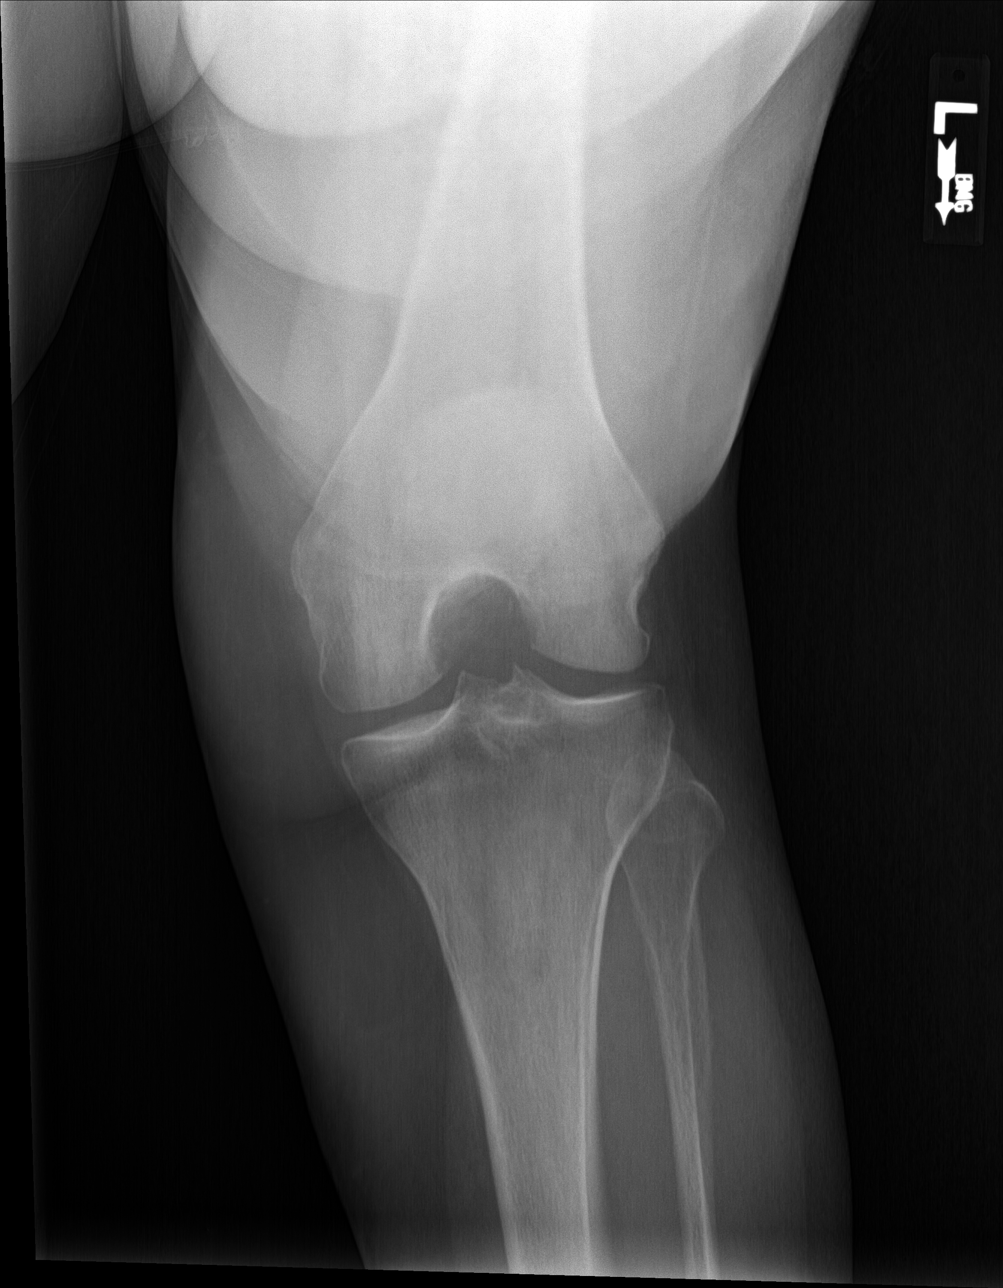

[knee lat]
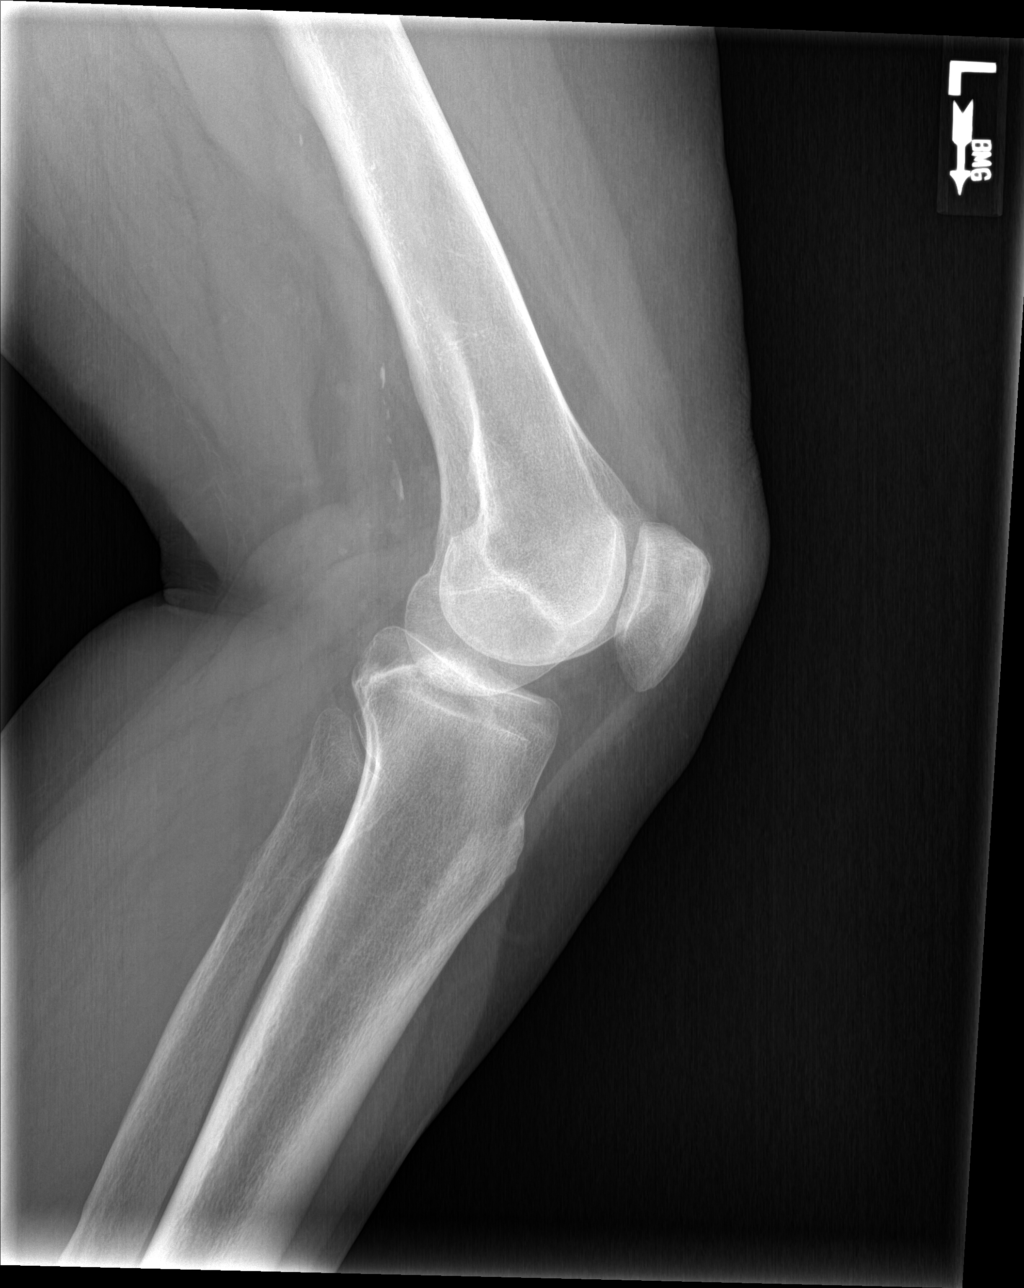

[4 of 4 positions shown; findings below may reference images not displayed]

FINDINGS: No evidence of fracture, dislocation, or joint effusion. No evidence
of arthropathy or other focal bone abnormality. Soft tissues are
unremarkable.
IMPRESSION: No acute abnormality

## 2021-09-11 ENCOUNTER — Other Ambulatory Visit: Payer: Self-pay | Admitting: Family Medicine

## 2021-09-11 DIAGNOSIS — E785 Hyperlipidemia, unspecified: Secondary | ICD-10-CM

## 2021-10-09 ENCOUNTER — Ambulatory Visit (INDEPENDENT_AMBULATORY_CARE_PROVIDER_SITE_OTHER): Payer: Medicare Other | Admitting: Family Medicine

## 2021-10-09 ENCOUNTER — Telehealth: Payer: Self-pay

## 2021-10-09 ENCOUNTER — Encounter: Payer: Self-pay | Admitting: Family Medicine

## 2021-10-09 VITALS — BP 130/72 | HR 67 | Temp 98.0°F | Resp 16 | Ht 64.0 in | Wt 194.4 lb

## 2021-10-09 DIAGNOSIS — E785 Hyperlipidemia, unspecified: Secondary | ICD-10-CM

## 2021-10-09 DIAGNOSIS — H65193 Other acute nonsuppurative otitis media, bilateral: Secondary | ICD-10-CM | POA: Diagnosis not present

## 2021-10-09 DIAGNOSIS — Z0000 Encounter for general adult medical examination without abnormal findings: Secondary | ICD-10-CM

## 2021-10-09 DIAGNOSIS — Z131 Encounter for screening for diabetes mellitus: Secondary | ICD-10-CM | POA: Diagnosis not present

## 2021-10-09 DIAGNOSIS — E039 Hypothyroidism, unspecified: Secondary | ICD-10-CM

## 2021-10-09 LAB — LIPID PANEL
Cholesterol: 189 mg/dL (ref 0–200)
HDL: 68.4 mg/dL (ref >39.00)
LDL Cholesterol: 95 mg/dL (ref 0–99)
NonHDL: 120.3
Total CHOL/HDL Ratio: 3
Triglycerides: 125 mg/dL (ref 0.0–149.0)
VLDL: 25 mg/dL (ref 0.0–40.0)

## 2021-10-09 LAB — COMPREHENSIVE METABOLIC PANEL
ALT: 11 U/L (ref 0–35)
AST: 17 U/L (ref 0–37)
Albumin: 4.3 g/dL (ref 3.5–5.2)
Alkaline Phosphatase: 53 U/L (ref 39–117)
BUN: 14 mg/dL (ref 6–23)
CO2: 30 mEq/L (ref 19–32)
Calcium: 9.6 mg/dL (ref 8.4–10.5)
Chloride: 102 mEq/L (ref 96–112)
Creatinine, Ser: 0.77 mg/dL (ref 0.40–1.20)
GFR: 74.52 mL/min (ref >60.00)
Glucose, Bld: 95 mg/dL (ref 70–99)
Potassium: 4 mEq/L (ref 3.5–5.1)
Sodium: 141 mEq/L (ref 135–145)
Total Bilirubin: 0.8 mg/dL (ref 0.2–1.2)
Total Protein: 6.9 g/dL (ref 6.0–8.3)

## 2021-10-09 LAB — TSH: TSH: 1.6 u[IU]/mL (ref 0.35–5.50)

## 2021-10-09 MED ORDER — LEVOTHYROXINE SODIUM 75 MCG PO TABS: ORAL_TABLET | ORAL | 3 refills | Status: DC

## 2021-10-09 MED ORDER — FLUTICASONE PROPIONATE 50 MCG/ACT NA SUSP: 2.0000 | Freq: Every day | NASAL | 11 refills | Status: DC

## 2021-10-09 MED ORDER — ATORVASTATIN CALCIUM 20 MG PO TABS: 20.0000 mg | ORAL_TABLET | ORAL | 0 refills | Status: DC

## 2021-10-09 NOTE — Telephone Encounter (Signed)
Kristopher Oppenheim called in needing clarification on atorvastatin (LIPITOR) 20 MG tablet, states instructions say every other day and says take daily.  ?

## 2021-10-09 NOTE — Patient Instructions (Addendum)
Flonase daily for now with allergy season.  ?Please schedule eye specialist visit.  ?No med changes today. Take care! ? ? ?Health Maintenance After Age 77 ?After age 38, you are at a higher risk for certain long-term diseases and infections as well as injuries from falls. Falls are a major cause of broken bones and head injuries in people who are older than age 63. Getting regular preventive care can help to keep you healthy and well. Preventive care includes getting regular testing and making lifestyle changes as recommended by your health care provider. Talk with your health care provider about: ?Which screenings and tests you should have. A screening is a test that checks for a disease when you have no symptoms. ?A diet and exercise plan that is right for you. ?What should I know about screenings and tests to prevent falls? ?Screening and testing are the best ways to find a health problem early. Early diagnosis and treatment give you the best chance of managing medical conditions that are common after age 38. Certain conditions and lifestyle choices may make you more likely to have a fall. Your health care provider may recommend: ?Regular vision checks. Poor vision and conditions such as cataracts can make you more likely to have a fall. If you wear glasses, make sure to get your prescription updated if your vision changes. ?Medicine review. Work with your health care provider to regularly review all of the medicines you are taking, including over-the-counter medicines. Ask your health care provider about any side effects that may make you more likely to have a fall. Tell your health care provider if any medicines that you take make you feel dizzy or sleepy. ?Strength and balance checks. Your health care provider may recommend certain tests to check your strength and balance while standing, walking, or changing positions. ?Foot health exam. Foot pain and numbness, as well as not wearing proper footwear, can make  you more likely to have a fall. ?Screenings, including: ?Osteoporosis screening. Osteoporosis is a condition that causes the bones to get weaker and break more easily. ?Blood pressure screening. Blood pressure changes and medicines to control blood pressure can make you feel dizzy. ?Depression screening. You may be more likely to have a fall if you have a fear of falling, feel depressed, or feel unable to do activities that you used to do. ?Alcohol use screening. Using too much alcohol can affect your balance and may make you more likely to have a fall. ?Follow these instructions at home: ?Lifestyle ?Do not drink alcohol if: ?Your health care provider tells you not to drink. ?If you drink alcohol: ?Limit how much you have to: ?0-1 drink a day for women. ?0-2 drinks a day for men. ?Know how much alcohol is in your drink. In the [place] one drink equals one 12 oz bottle of beer (355 mL), one 5 oz glass of wine (148 mL), or one 1? oz glass of hard liquor (44 mL). ?Do not use any products that contain nicotine or tobacco. These products include cigarettes, chewing tobacco, and vaping devices, such as e-cigarettes. If you need help quitting, ask your health care provider. ?Activity ? ?Follow a regular exercise program to stay fit. This will help you maintain your balance. Ask your health care provider what types of exercise are appropriate for you. ?If you need a cane or walker, use it as recommended by your health care provider. ?Wear supportive shoes that have nonskid soles. ?Safety ? ?Remove any tripping hazards, such as  rugs, cords, and clutter. ?Install safety equipment such as grab bars in bathrooms and safety rails on stairs. ?Keep rooms and walkways well-lit. ?General instructions ?Talk with your health care provider about your risks for falling. Tell your health care provider if: ?You fall. Be sure to tell your health care provider about all falls, even ones that seem minor. ?You feel dizzy, tiredness (fatigue),  or off-balance. ?Take over-the-counter and prescription medicines only as told by your health care provider. These include supplements. ?Eat a healthy diet and maintain a healthy weight. A healthy diet includes low-fat dairy products, low-fat (lean) meats, and fiber from whole grains, beans, and lots of fruits and vegetables. ?Stay current with your vaccines. ?Schedule regular health, dental, and eye exams. ?Summary ?Having a healthy lifestyle and getting preventive care can help to protect your health and wellness after age 37. ?Screening and testing are the best way to find a health problem early and help you avoid having a fall. Early diagnosis and treatment give you the best chance for managing medical conditions that are more common for people who are older than age 21. ?Falls are a major cause of broken bones and head injuries in people who are older than age 63. Take precautions to prevent a fall at home. ?Work with your health care provider to learn what changes you can make to improve your health and wellness and to prevent falls. ?This information is not intended to replace advice given to you by your health care provider. Make sure you discuss any questions you have with your health care provider. ?Document Revised: 11/14/2020 Document Reviewed: 11/14/2020 ?Elsevier Patient Education ? 2022 [employer]. ? ? ?

## 2021-10-09 NOTE — Progress Notes (Signed)
? ?Subjective:  ?Patient ID: Stephanie Carter, female    DOB: December 22, 1945  Age: 76 y.o. MRN: [id number] ? ?CC:  ?Chief Complaint  ?Patient presents with  ? Annual Exam  ?  Pt here for physical   ? ? ?HPI ?Stephanie Carter presents for Annual Exam ? ?Hypothyroidism: ?Lab Results  ?Component Value Date  ? TSH 1.410 09/16/2020  ?Taking medication daily.  Synthroid 75 mcg.  ?Some cold intolerance - past year at times.  No new hair or skin changes, heart palpitations or new fatigue. No new weight changes.  ? ?Allergic rhinitis: ?Treated with Flonase, working well when using it. Used this morning.  ? ?Hyperlipidemia: ?Lipitor 20 mg QOD. No new side effects, myalgias.  ?Lab Results  ?Component Value Date  ? CHOL 173 09/16/2020  ? HDL 64 09/16/2020  ? [place] 92 09/16/2020  ? TRIG 95 09/16/2020  ? CHOLHDL 2.7 09/16/2020  ? ?Lab Results  ?Component Value Date  ? ALT 13 09/16/2020  ? AST 21 09/16/2020  ? ALKPHOS 60 09/16/2020  ? BILITOT 0.6 09/16/2020  ? ? ? ? ?  10/09/2021  ?  9:34 AM 09/16/2020  ?  2:56 PM 10/12/2019  ?  8:07 AM 06/10/2019  ? 10:19 AM 05/01/2018  ?  9:07 AM  ?Depression screen PHQ 2/9  ?Decreased Interest 0 0 0 0 0  ?Down, Depressed, Hopeless  0 0 0 0  ?PHQ - 2 Score 0 0 0 0 0  ? ? ?  05/01/2018  ?  9:07 AM 06/10/2019  ? 10:19 AM 10/12/2019  ?  8:07 AM 09/16/2020  ?  2:56 PM 10/09/2021  ?  9:33 AM  ?Fall Risk  ?Falls in the past year? No 0 0 1 1  ?Was there an injury with Fall?  0  1 0  ?Fall Risk Category Calculator  0  2 1  ?Fall Risk Category  Low  Moderate Low  ?Patient Fall Risk Level   Low fall risk Moderate fall risk Low fall risk  ?Patient at Risk for Falls Due to     No Fall Risks  ?Fall risk Follow up  Falls evaluation completed Falls evaluation completed Falls evaluation completed Falls evaluation completed  ? ?Functional Status Survey: ?Is the patient deaf or have difficulty hearing?: No ?Does the patient have difficulty seeing, even when wearing glasses/contacts?: No ?Does the patient have difficulty  concentrating, remembering, or making decisions?: No ?Does the patient have difficulty walking or climbing stairs?: No ?Does the patient have difficulty dressing or bathing?: No ?Does the patient have difficulty doing errands alone such as visiting a doctor's office or shopping?: No ? ?[place] Office Visit from 10/09/2021 in [facility]  ?AUDIT-C Score 0  ? ?  ? ? ?  10/09/2021  ?  9:42 AM 09/16/2020  ?  3:00 PM 06/10/2019  ? 10:17 AM 05/01/2018  ?  9:09 AM  ?6CIT Screen  ?What Year? 0 points 0 points 0 points 0 points  ?What month? 0 points 0 points 0 points 0 points  ?What time? 0 points 0 points 0 points 0 points  ?Count back from 20 0 points 0 points 0 points 0 points  ?Months in reverse 0 points 0 points 0 points 0 points  ?Repeat phrase 0 points 0 points 0 points 0 points  ?Total Score 0 points 0 points 0 points 0 points  ? ?Has living will - will get a copy scanned.  ? ?Health Maintenance  ?  Topic Date Due  ? Zoster Vaccines- Shingrix (1 of 2) 01/08/2022 (Originally 07/25/1994)  ? TETANUS/TDAP  10/10/2022 (Originally 07/10/2019)  ? INFLUENZA VACCINE  02/06/2022  ? Pneumonia Vaccine 26+ Years old  Completed  ? DEXA SCAN  Completed  ? COVID-19 Vaccine  Completed  ? Hepatitis C Screening  Completed  ? HPV VACCINES  Aged Out  ? COLONOSCOPY (Pts 45-37yr Insurance coverage will need to be confirmed)  Discontinued  ?Shingrix - recommended. ? ?Immunization History  ?Administered Date(s) Administered  ? Fluad Quad(high Dose 65+) 06/10/2019  ? Influenza Split 06/12/2012, 03/25/2013  ? Influenza, High Dose Seasonal PF 05/01/2018  ? Influenza-Unspecified 06/10/2016, 04/23/2017, 08/01/2020  ? PFIZER(Purple Top)SARS-COV-2 Vaccination 08/15/2019, 09/05/2019, 05/20/2020, 02/03/2021, 05/10/2021  ? Pneumococcal Conjugate-13 12/22/2015  ? Pneumococcal Polysaccharide-23 02/12/2017  ? Pneumococcal-Unspecified 07/09/2009  ? Tdap 07/09/2009  ? ? ?No results found. ?Optho - due - plans to make  appt.  ? ?Dental:Within Last 6 months ? ?Alcohol: up to 6-7 per week.  ? ?Tobacco: none - quit in 2016. ? ?Exercise: [employer], water aerobics 2-3d /week.walking 20-30 min most days per week. ? ?Followed by dermatology with tx for seb k's.  ? ?History ?Patient Active Problem List  ? Diagnosis Date Noted  ? Psoriasis 11/05/2013  ? Smoking 11/05/2013  ? Lipid disorder 11/09/2011  ? Hypothyroid 11/09/2011  ? ?Past Medical History:  ?Diagnosis Date  ? Allergy   ? Thyroid disease   ? ?Past Surgical History:  ?Procedure Laterality Date  ? ABDOMINAL HYSTERECTOMY    ? ?No Known Allergies ?Prior to Admission medications   ?Medication Sig Start Date End Date Taking? Authorizing Provider  ?atorvastatin (LIPITOR) 20 MG tablet TAKE 1 TABLET BY MOUTH DAILY 09/11/21  Yes GWendie Agreste MD  ?Calcium Carb-Cholecalciferol (CALCIUM + D3 PO) Take by mouth.   Yes [provider]  ?calcium gluconate 500 MG tablet Take 1 tablet by mouth 3 (three) times daily.   Yes [provider]  ?fluticasone (FLONASE) 50 MCG/ACT nasal spray Place 2 sprays into both nostrils daily. 08/24/20  Yes GWendie Agreste MD  ?levothyroxine (SYNTHROID) 75 MCG tablet TAKE 1 TABLET(75 MCG) BY MOUTH DAILY 09/16/20  Yes GWendie Agreste MD  ?Multiple Vitamin (MULTIVITAMIN) tablet Take 1 tablet by mouth daily.   Yes [provider]  ? ?Social History  ? ?Socioeconomic History  ? Marital status: Single  ?  Spouse name: Not on file  ? Number of children: Not on file  ? Years of education: Not on file  ? Highest education level: Not on file  ?Occupational History  ? Occupation: wait person  ?Tobacco Use  ? Smoking status: Former  ?  Packs/day: 1.00  ?  Years: 50.00  ?  Pack years: 50.00  ?  Types: Cigarettes  ?  Quit date: 07/03/2015  ?  Years since quitting: 6.2  ? Smokeless tobacco: Never  ?Vaping Use  ? Vaping Use: Never used  ?Substance and Sexual Activity  ? Alcohol use: Yes  ?  Alcohol/week: 5.0 standard drinks  ?  Types: 5 Glasses  of wine per week  ? Drug use: No  ? Sexual activity: Yes  ?Other Topics Concern  ? Not on file  ?Social History Narrative  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 ?Social Determinants of Health  ? ?Financial Resource Strain: Not on file  ?Food Insecurity: Not on file  ?Transportation Needs: Not on file  ?Physical Activity: Not on file  ?  Stress: Not on file  ?Social Connections: Not on file  ?Intimate Partner Violence: Not on file  ? ? ?Review of Systems ?13 point review of systems per patient health survey noted.  Negative other than as indicated above or in HPI.  ? ? ?Objective:  ? ?Vitals:  ? 10/09/21 0930  ?BP: 130/72  ?Pulse: 67  ?Resp: 16  ?Temp: 98 ?F (36.7 ?C)  ?TempSrc: Temporal  ?SpO2: 97%  ?Weight: 194 lb 6.4 oz (88.2 kg)  ?Height: '5\' 4"'$  (1.626 m)  ? ?Physical Exam ?Constitutional:   ?   Appearance: She is well-developed.  ?HENT:  ?   Head: Normocephalic and atraumatic.  ?   Right Ear: External ear normal.  ?   Left Ear: External ear normal.  ?Eyes:  ?   Conjunctiva/sclera: Conjunctivae normal.  ?   Pupils: Pupils are equal, round, and reactive to light.  ?Neck:  ?   Thyroid: No thyromegaly.  ?Cardiovascular:  ?   Rate and Rhythm: Normal rate and regular rhythm.  ?   Heart sounds: Normal heart sounds. No murmur heard. ?Pulmonary:  ?   Effort: Pulmonary effort is normal. No respiratory distress.  ?   Breath sounds: Normal breath sounds. No wheezing.  ?Abdominal:  ?   General: Bowel sounds are normal.  ?   Palpations: Abdomen is soft.  ?   Tenderness: There is no abdominal tenderness.  ?Musculoskeletal:     ?   General: No tenderness. Normal range of motion.  ?   Cervical back: Normal range of motion and neck supple.  ?Lymphadenopathy:  ?   Cervical: No cervical adenopathy.  ?Skin: ?   General: Skin is warm and dry.  ?   Findings: No rash.  ?Neurological:  ?   Mental Status: She is alert and oriented to person, place, and time.  ?Psychiatric:     ?   Behavior: Behavior normal.     ?   Thought  Content: Thought content normal.  ? ? ?Assessment & Plan:  ?Stephanie Carter is a 76 y.o. female . ?Annual physical exam - Plan: Comprehensive metabolic panel, Lipid panel, TSH ? - -anticipatory guidance as b

## 2021-10-09 NOTE — Telephone Encounter (Signed)
Called back and informed that this is half tablet daily or whole tablet every other day dosing.  ?

## 2022-03-01 ENCOUNTER — Ambulatory Visit (INDEPENDENT_AMBULATORY_CARE_PROVIDER_SITE_OTHER): Payer: Medicare Other

## 2022-03-01 DIAGNOSIS — Z0000 Encounter for general adult medical examination without abnormal findings: Secondary | ICD-10-CM

## 2022-03-01 NOTE — Progress Notes (Addendum)
Subjective:   Stephanie Carter is a 76 y.o. female who presents for Medicare Annual (Subsequent) preventive examination.   I connected with Stephanie Carter today by telephone and verified that I am speaking with the correct person using two identifiers. Location patient: home Location provider: work Persons participating in the virtual visit: patient, provider.   I discussed the limitations, risks, security and privacy concerns of performing an evaluation and management service by telephone and the availability of in person appointments. I also discussed with the patient that there may be a patient responsible charge related to this service. The patient expressed understanding and verbally consented to this telephonic visit.    Interactive audio and video telecommunications were attempted between this provider and patient, however failed, due to patient having technical difficulties OR patient did not have access to video capability.  We continued and completed visit with audio only.    Review of Systems           Objective:    Today's Vitals   There is no height or weight on file to calculate BMI.     03/01/2022    8:51 AM 09/16/2020    3:02 PM 10/30/2019    2:05 PM 06/10/2019   10:20 AM 05/01/2018    9:09 AM 02/12/2017   10:05 AM 12/22/2015   10:38 AM  Advanced Directives  Does Patient Have a Medical Advance Directive? Yes Yes Yes Yes Yes Yes Yes  Type of Paramedic of [place];Living will Living will;Healthcare Power of [place];Living will [place];Living will Living will Living will Living will  Does patient want to make changes to medical advance directive?   No - Patient declined Yes (MAU/Ambulatory/Procedural Areas - Information given)  No - Patient declined   Copy of [place] in Chart? No - copy requested No - copy requested No - copy requested    No - copy requested  Would patient  like information on creating a medical advance directive?   No - Patient declined        Current Medications (verified) Outpatient Encounter Medications as of 03/01/2022  Medication Sig   atorvastatin (LIPITOR) 20 MG tablet Take 1 tablet (20 mg total) by mouth every other day. TAKE 1 TABLET BY MOUTH DAILY   fluticasone (FLONASE) 50 MCG/ACT nasal spray Place 2 sprays into both nostrils daily.   levothyroxine (SYNTHROID) 75 MCG tablet TAKE 1 TABLET(75 MCG) BY MOUTH DAILY   Calcium Carb-Cholecalciferol (CALCIUM + D3 PO) Take by mouth. (Patient not taking: Reported on 03/01/2022)   calcium gluconate 500 MG tablet Take 1 tablet by mouth 3 (three) times daily. (Patient not taking: Reported on 03/01/2022)   Multiple Vitamin (MULTIVITAMIN) tablet Take 1 tablet by mouth daily. (Patient not taking: Reported on 03/01/2022)   No facility-administered encounter medications on file as of 03/01/2022.    Allergies (verified) Patient has no known allergies.   History: Past Medical History:  Diagnosis Date   Allergy    Thyroid disease    Past Surgical History:  Procedure Laterality Date   ABDOMINAL HYSTERECTOMY     Family History  Problem Relation Age of Onset   Stroke Father    Social History   Socioeconomic History   Marital status: Single    Spouse name: Not on file   Number of children: Not on file   Years of education: Not on file   Highest education level: Not on file  Occupational History   Occupation: wait person  Tobacco Use   Smoking status: Former    Packs/day: 1.00    Years: 50.00    Total pack years: 50.00    Types: Cigarettes    Quit date: 07/03/2015    Years since quitting: 6.6   Smokeless tobacco: Never  Vaping Use   Vaping Use: Never used  Substance and Sexual Activity   Alcohol use: Yes    Alcohol/week: 5.0 standard drinks of alcohol    Types: 5 Glasses of wine per week   Drug use: No   Sexual activity: Yes  Other Topics Concern   Not on file  Social History  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   Social Determinants of Health   Financial Resource Strain: Low Risk  (03/01/2022)   Overall Financial Resource Strain (CARDIA)    Difficulty of Paying Living Expenses: Not hard at all  Food Insecurity: No Food Insecurity (03/01/2022)   Hunger Vital Sign    Worried About Running Out of Food in the Last Year: Never true    Ran Out of Food in the Last Year: Never true  Transportation Needs: No Transportation Needs (03/01/2022)   PRAPARE - Hydrologist (Medical): No    Lack of Transportation (Non-Medical): No  Physical Activity: Sufficiently Active (03/01/2022)   Exercise Vital Sign    Days of Exercise per Week: 4 days    Minutes of Exercise per Session: 50 min  Stress: No Stress Concern Present (03/01/2022)   [place]    Feeling of Stress : Not at all  Social Connections: Moderately Integrated (03/01/2022)   Social Connection and Isolation Panel [NHANES]    Frequency of Communication with Friends and Family: Three times a week    Frequency of Social Gatherings with Friends and Family: Three times a week    Attends Religious Services: More than 4 times per year    Active Member of Clubs or Organizations: Yes    Attends Archivist Meetings: 1 to 4 times per year    Marital Status: Divorced    Tobacco Counseling Counseling given: Not Answered   Clinical Intake:  Pre-visit preparation completed: Yes  Pain : No/denies pain     Nutritional Risks: None Diabetes: No  How often do you need to have someone help you when you read instructions, pamphlets, or other written materials from your doctor or pharmacy?: 1 - Never What is the last grade level you completed in school?: college  Diabetic?no   Interpreter Needed?: No  Information entered by :: L.Wilson,LPN   Activities of Daily Living    10/09/2021    9:42  AM  In your present state of health, do you have any difficulty performing the following activities:  Hearing? 0  Vision? 0  Difficulty concentrating or making decisions? 0  Walking or climbing stairs? 0  Dressing or bathing? 0  Doing errands, shopping? 0    Patient Care Team: Wendie Agreste, MD as PCP - General (Family Medicine)  Indicate any recent Medical Services you may have received from other than [facility] providers in the past year (date may be approximate).     Assessment:   This is a routine wellness examination for Declynn.  Hearing/Vision screen Vision Screening - Comments:: Patient to make eye appointment   Dietary issues and exercise activities discussed:     Goals Addressed  This Visit's Progress    Protect My Health         Depression Screen    03/01/2022    8:52 AM 03/01/2022    8:50 AM 10/09/2021    9:34 AM 09/16/2020    2:56 PM 10/12/2019    8:07 AM 06/10/2019   10:19 AM 05/01/2018    9:07 AM  PHQ 2/9 Scores  PHQ - 2 Score 0 0 0 0 0 0 0    Fall Risk    03/01/2022    8:52 AM 10/09/2021    9:33 AM 09/16/2020    2:56 PM 10/12/2019    8:07 AM 06/10/2019   10:19 AM  Fall Risk   Falls in the past year? 0 1 1 0 0  Number falls in past yr: 0 0 0  0  Injury with Fall? 0 0 1  0  Risk for fall due to :  No Fall Risks     Follow up Falls evaluation completed;Education provided Falls evaluation completed Falls evaluation completed Falls evaluation completed Falls evaluation completed    FALL RISK PREVENTION PERTAINING TO THE HOME:  Any stairs in or around the home? No  If so, are there any without handrails? No  Home free of loose throw rugs in walkways, pet beds, electrical cords, etc? Yes  Adequate lighting in your home to reduce risk of falls? Yes   ASSISTIVE DEVICES UTILIZED TO PREVENT FALLS:  Life alert? No  Use of a cane, walker or w/c? No  Grab bars in the bathroom? Yes  Shower chair or bench in shower? No  Elevated toilet seat or a  handicapped toilet? No    Cognitive Function:  .Normal cognitive status assessed by telephone conversation by this Nurse Health Advisor. No abnormalities found.        10/09/2021    9:42 AM 09/16/2020    3:00 PM 06/10/2019   10:17 AM 05/01/2018    9:09 AM  6CIT Screen  What Year? 0 points 0 points 0 points 0 points  What month? 0 points 0 points 0 points 0 points  What time? 0 points 0 points 0 points 0 points  Count back from 20 0 points 0 points 0 points 0 points  Months in reverse 0 points 0 points 0 points 0 points  Repeat phrase 0 points 0 points 0 points 0 points  Total Score 0 points 0 points 0 points 0 points    Immunizations Immunization History  Administered Date(s) Administered   Fluad Quad(high Dose 65+) 06/10/2019   Influenza Split 06/12/2012, 03/25/2013   Influenza, High Dose Seasonal PF 05/01/2018   Influenza-Unspecified 06/10/2016, 04/23/2017, 08/01/2020   PFIZER(Purple Top)SARS-COV-2 Vaccination 08/15/2019, 09/05/2019, 05/20/2020, 02/03/2021, 05/10/2021   Pneumococcal Conjugate-13 12/22/2015   Pneumococcal Polysaccharide-23 02/12/2017   Pneumococcal-Unspecified 07/09/2009   Tdap 07/09/2009    TDAP status: Due, Education has been provided regarding the importance of this vaccine. Advised may receive this vaccine at local pharmacy or Health Dept. Aware to provide a copy of the vaccination record if obtained from local pharmacy or Health Dept. Verbalized acceptance and understanding.  Flu Vaccine status: Up to date  Pneumococcal vaccine status: Up to date  Covid-19 vaccine status: Completed vaccines  Qualifies for Shingles Vaccine? Yes   Zostavax completed No   Shingrix Completed?: No.    Education has been provided regarding the importance of this vaccine. Patient has been advised to call insurance company to determine out of pocket expense if they have not yet  received this vaccine. Advised may also receive vaccine at local pharmacy or Health Dept. Verbalized  acceptance and understanding.  Screening Tests Health Maintenance  Topic Date Due   Zoster Vaccines- Shingrix (1 of 2) Never done   COVID-19 Vaccine (6 - Pfizer series) 07/05/2021   INFLUENZA VACCINE  02/06/2022   TETANUS/TDAP  10/10/2022 (Originally 07/10/2019)   Pneumonia Vaccine 66+ Years old  Completed   DEXA SCAN  Completed   Hepatitis C Screening  Completed   HPV VACCINES  Aged Out   COLONOSCOPY (Pts 45-61yr Insurance coverage will need to be confirmed)  Discontinued    Health Maintenance  Health Maintenance Due  Topic Date Due   Zoster Vaccines- Shingrix (1 of 2) Never done   COVID-19 Vaccine (6 - Pfizer series) 07/05/2021   INFLUENZA VACCINE  02/06/2022    Colorectal cancer screening: No longer required.   Mammogram status: No longer required due to agfe.  Bone Density status: Completed 06/13/2017. Results reflect: Bone density results: OSTEOPENIA. Repeat every 5 years.  Lung Cancer Screening: (Low Dose CT Chest recommended if Age 77-80years, 30 pack-year currently smoking OR have quit w/in 15years.) does not qualify.   Lung Cancer Screening Referral: n/a  Additional Screening:  Hepatitis C Screening: does not qualify;   Vision Screening: Recommended annual ophthalmology exams for early detection of glaucoma and other disorders of the eye. Is the patient up to date with their annual eye exam?  Yes  Who is the provider or what is the name of the office in which the patient attends annual eye exams? Patient to call schedule  If pt is not established with a provider, would they like to be referred to a provider to establish care? No .   Dental Screening: Recommended annual dental exams for proper oral hygiene  Community Resource Referral / Chronic Care Management: CRR required this visit?  No   CCM required this visit?  No      Plan:     I have personally reviewed and noted the following in the patient's chart:   Medical and social history Use of  alcohol, tobacco or illicit drugs  Current medications and supplements including opioid prescriptions. Patient is not currently taking opioid prescriptions. Functional ability and status Nutritional status Physical activity Advanced directives List of other physicians Hospitalizations, surgeries, and ER visits in previous 12 months Vitals Screenings to include cognitive, depression, and falls Referrals and appointments  In addition, I have reviewed and discussed with patient certain preventive protocols, quality metrics, and best practice recommendations. A written personalized care plan for preventive services as well as general preventive health recommendations were provided to patient.     LDaphane Shepherd LPN   87/32/2025  Nurse Notes: none

## 2022-03-01 NOTE — Patient Instructions (Signed)
Stephanie Carter , Thank you for taking time to come for your Medicare Wellness Visit. I appreciate your ongoing commitment to your health goals. Please review the following plan we discussed and let me know if I can assist you in the future.   Screening recommendations/referrals: Colonoscopy: no longer required  Mammogram: no longer required  Bone Density: 06/13/2017  due 06/2022 Recommended yearly ophthalmology/optometry visit for glaucoma screening and checkup Recommended yearly dental visit for hygiene and checkup  Vaccinations: Influenza vaccine: completed  Pneumococcal vaccine: completed  Tdap vaccine: due  Shingles vaccine: will consider     Advanced directives: yes   Conditions/risks identified: none   Next appointment: none    Preventive Care 47 Years and Older, Female Preventive care refers to lifestyle choices and visits with your health care provider that can promote health and wellness. What does preventive care include? A yearly physical exam. This is also called an annual well check. Dental exams once or twice a year. Routine eye exams. Ask your health care provider how often you should have your eyes checked. Personal lifestyle choices, including: Daily care of your teeth and gums. Regular physical activity. Eating a healthy diet. Avoiding tobacco and drug use. Limiting alcohol use. Practicing safe sex. Taking low-dose aspirin every day. Taking vitamin and mineral supplements as recommended by your health care provider. What happens during an annual well check? The services and screenings done by your health care provider during your annual well check will depend on your age, overall health, lifestyle risk factors, and family history of disease. Counseling  Your health care provider may ask you questions about your: Alcohol use. Tobacco use. Drug use. Emotional well-being. Home and relationship well-being. Sexual activity. Eating habits. History of  falls. Memory and ability to understand (cognition). Work and work Statistician. Reproductive health. Screening  You may have the following tests or measurements: Height, weight, and BMI. Blood pressure. Lipid and cholesterol levels. These may be checked every 5 years, or more frequently if you are over 76 years old. Skin check. Lung cancer screening. You may have this screening every year starting at age 35 if you have a 30-pack-year history of smoking and currently smoke or have quit within the past 15 years. Fecal occult blood test (FOBT) of the stool. You may have this test every year starting at age 52. Flexible sigmoidoscopy or colonoscopy. You may have a sigmoidoscopy every 5 years or a colonoscopy every 10 years starting at age 62. Hepatitis C blood test. Hepatitis B blood test. Sexually transmitted disease (STD) testing. Diabetes screening. This is done by checking your blood sugar (glucose) after you have not eaten for a while (fasting). You may have this done every 1-3 years. Bone density scan. This is done to screen for osteoporosis. You may have this done starting at age 92. Mammogram. This may be done every 1-2 years. Talk to your health care provider about how often you should have regular mammograms. Talk with your health care provider about your test results, treatment options, and if necessary, the need for more tests. Vaccines  Your health care provider may recommend certain vaccines, such as: Influenza vaccine. This is recommended every year. Tetanus, diphtheria, and acellular pertussis (Tdap, Td) vaccine. You may need a Td booster every 10 years. Zoster vaccine. You may need this after age 32. Pneumococcal 13-valent conjugate (PCV13) vaccine. One dose is recommended after age 67. Pneumococcal polysaccharide (PPSV23) vaccine. One dose is recommended after age 45. Talk to your health care provider  about which screenings and vaccines you need and how often you need  them. This information is not intended to replace advice given to you by your health care provider. Make sure you discuss any questions you have with your health care provider. Document Released: 07/22/2015 Document Revised: 03/14/2016 Document Reviewed: 04/26/2015 Elsevier Interactive Patient Education  2017 [place] Prevention in the Home Falls can cause injuries. They can happen to people of all ages. There are many things you can do to make your home safe and to help prevent falls. What can I do on the outside of my home? Regularly fix the edges of walkways and driveways and fix any cracks. Remove anything that might make you trip as you walk through a door, such as a raised step or threshold. Trim any bushes or trees on the path to your home. Use bright outdoor lighting. Clear any walking paths of anything that might make someone trip, such as rocks or tools. Regularly check to see if handrails are loose or broken. Make sure that both sides of any steps have handrails. Any raised decks and porches should have guardrails on the edges. Have any leaves, snow, or ice cleared regularly. Use sand or salt on walking paths during winter. Clean up any spills in your garage right away. This includes oil or grease spills. What can I do in the bathroom? Use night lights. Install grab bars by the toilet and in the tub and shower. Do not use towel bars as grab bars. Use non-skid mats or decals in the tub or shower. If you need to sit down in the shower, use a plastic, non-slip stool. Keep the floor dry. Clean up any water that spills on the floor as soon as it happens. Remove soap buildup in the tub or shower regularly. Attach bath mats securely with double-sided non-slip rug tape. Do not have throw rugs and other things on the floor that can make you trip. What can I do in the bedroom? Use night lights. Make sure that you have a light by your bed that is easy to reach. Do not use  any sheets or blankets that are too big for your bed. They should not hang down onto the floor. Have a firm chair that has side arms. You can use this for support while you get dressed. Do not have throw rugs and other things on the floor that can make you trip. What can I do in the kitchen? Clean up any spills right away. Avoid walking on wet floors. Keep items that you use a lot in easy-to-reach places. If you need to reach something above you, use a strong step stool that has a grab bar. Keep electrical cords out of the way. Do not use floor polish or wax that makes floors slippery. If you must use wax, use non-skid floor wax. Do not have throw rugs and other things on the floor that can make you trip. What can I do with my stairs? Do not leave any items on the stairs. Make sure that there are handrails on both sides of the stairs and use them. Fix handrails that are broken or loose. Make sure that handrails are as long as the stairways. Check any carpeting to make sure that it is firmly attached to the stairs. Fix any carpet that is loose or worn. Avoid having throw rugs at the top or bottom of the stairs. If you do have throw rugs, attach them to the floor  with carpet tape. Make sure that you have a light switch at the top of the stairs and the bottom of the stairs. If you do not have them, ask someone to add them for you. What else can I do to help prevent falls? Wear shoes that: Do not have high heels. Have rubber bottoms. Are comfortable and fit you well. Are closed at the toe. Do not wear sandals. If you use a stepladder: Make sure that it is fully opened. Do not climb a closed stepladder. Make sure that both sides of the stepladder are locked into place. Ask someone to hold it for you, if possible. Clearly mark and make sure that you can see: Any grab bars or handrails. First and last steps. Where the edge of each step is. Use tools that help you move around (mobility aids)  if they are needed. These include: Canes. Walkers. Scooters. Crutches. Turn on the lights when you go into a dark area. Replace any light bulbs as soon as they burn out. Set up your furniture so you have a clear path. Avoid moving your furniture around. If any of your floors are uneven, fix them. If there are any pets around you, be aware of where they are. Review your medicines with your doctor. Some medicines can make you feel dizzy. This can increase your chance of falling. Ask your doctor what other things that you can do to help prevent falls. This information is not intended to replace advice given to you by your health care provider. Make sure you discuss any questions you have with your health care provider. Document Released: 04/21/2009 Document Revised: 12/01/2015 Document Reviewed: 07/30/2014 Elsevier Interactive Patient Education  2017 [employer].

## 2022-07-18 ENCOUNTER — Telehealth: Payer: Self-pay | Admitting: Family Medicine

## 2022-07-18 NOTE — Telephone Encounter (Signed)
Patient states she would like to transfer care from Dr. Carlota Raspberry to Dr. Jerline Pain. States she is closer to [place] office. Is this okay with you?

## 2022-07-18 NOTE — Telephone Encounter (Signed)
Yes - ok with me. Very nice patient, have enjoyed providing her care.

## 2022-07-19 NOTE — Telephone Encounter (Signed)
[place] with me.   Algis Greenhouse. Jerline Pain, MD 07/19/2022 7:21 AM

## 2022-08-08 ENCOUNTER — Ambulatory Visit (INDEPENDENT_AMBULATORY_CARE_PROVIDER_SITE_OTHER): Payer: Medicare Other | Admitting: Family Medicine

## 2022-08-08 VITALS — BP 131/73 | HR 59 | Temp 97.2°F | Ht 64.0 in | Wt 195.2 lb

## 2022-08-08 DIAGNOSIS — E789 Disorder of lipoprotein metabolism, unspecified: Secondary | ICD-10-CM | POA: Diagnosis not present

## 2022-08-08 DIAGNOSIS — E039 Hypothyroidism, unspecified: Secondary | ICD-10-CM | POA: Diagnosis not present

## 2022-08-08 DIAGNOSIS — L409 Psoriasis, unspecified: Secondary | ICD-10-CM

## 2022-08-08 DIAGNOSIS — M1990 Unspecified osteoarthritis, unspecified site: Secondary | ICD-10-CM | POA: Diagnosis not present

## 2022-08-08 NOTE — Assessment & Plan Note (Signed)
She is on Lipitor 20 mg every other day.  She will come back soon for CPE and we will check lipids at that time.

## 2022-08-08 NOTE — Assessment & Plan Note (Signed)
No red flags.  Does have crepitus on exam in her knees today.  She had x-rays a few years ago which did not show any obvious degenerative changes.  We discussed referral to PT or sports medicine however she deferred.  She will continue with home treatment.  We discussed ice and compression.  She will continue with resistance training.  Also discussed importance of low impact activities.  She will let me know if not proving and we can refer as above.

## 2022-08-08 NOTE — Assessment & Plan Note (Signed)
Stable on Synthroid 75 mcg daily.  She will come back for CPE soon we will check lipids at that time.

## 2022-08-08 NOTE — Patient Instructions (Signed)
It was very nice to see you today!  You may have had a boil on the back of your scalp.  Please let me know if this does not continue to improve over the next several weeks.  You have arthritis in your legs.  Please continue with your exercises.  Let me know if you like to see physical therapist.  I will see back in April for your annual physical.  Come back sooner if needed.  Take care, Dr Jerline Pain  PLEASE NOTE:  If you had any lab tests, please let us know if you have not heard back within a few days. You may see your results on mychart before we have a chance to review them but we will give you a call once they are reviewed by [place].   If we ordered any referrals today, please let us know if you have not heard from their office within the next week.   If you had any urgent prescriptions sent in today, please check with the pharmacy within an hour of our visit to make sure the prescription was transmitted appropriately.   Please try these tips to maintain a healthy lifestyle:  Eat at least 3 REAL meals and 1-2 snacks per day.  Aim for no more than 5 hours between eating.  If you eat breakfast, please do so within one hour of getting up.   Each meal should contain half fruits/vegetables, one quarter protein, and one quarter carbs (no bigger than a computer mouse)  Cut down on sweet beverages. This includes juice, soda, and sweet tea.   Drink at least 1 glass of water with each meal and aim for at least 8 glasses per day  Exercise at least 150 minutes every week.

## 2022-08-08 NOTE — Progress Notes (Signed)
Stephanie Carter is a 77 y.o. female who presents today for an office visit.  She is transferring care to this office.  Assessment/Plan:  New/Acute Problems: Skin lesion Only has a small indurated area today.  No areas of fluctuance.  Possibly could have been an abscess that has resolved.  Also consider epidermal cyst however would be unusual for this to grow to such as size over a month.  No areas amenable to I&D or excision today.  Will continue with watchful waiting.  She will let me know if this does not continue to improve over the next several weeks.  Chronic Problems Addressed Today: Osteoarthritis No red flags.  Does have crepitus on exam in her knees today.  She had x-rays a few years ago which did not show any obvious degenerative changes.  We discussed referral to PT or sports medicine however she deferred.  She will continue with home treatment.  We discussed ice and compression.  She will continue with resistance training.  Also discussed importance of low impact activities.  She will let me know if not proving and we can refer as above.  Lipid disorder She is on Lipitor 20 mg every other day.  She will come back soon for CPE and we will check lipids at that time.  Hypothyroid Stable on Synthroid 75 mcg daily.  She will come back for CPE soon we will check lipids at that time.     Subjective:  HPI:  See A/P for status of chronic conditions.  She has a few issues that she would like to discuss today.  About a month ago she noticed a cyst pop up on the back of her left scalp.  She had some minimal pain to the area.  She was combing her hair when she noticed that she took the top off of the area and she had some drainage to the area.  Still has a bit of pain when pressing on it.  No fevers or chills.  Seems to be decreasing in size  She has also had ongoing issues with bilateral lower leg pain.  She is seeing her previous PCP and orthopedics for this in the past.  No numbness  or tingling.  No back pain.  No neck pain.  Located in bilateral knees down to her feet.  ROS: Per HPI, otherwise a complete review of systems was negative.   PMH:  The following were reviewed and entered/updated in epic: Past Medical History:  Diagnosis Date   Allergy    Thyroid disease    Patient Active Problem List   Diagnosis Date Noted   Osteoarthritis 08/08/2022   Psoriasis 11/05/2013   Former smoker 11/05/2013   Lipid disorder 11/09/2011   Hypothyroid 11/09/2011   Past Surgical History:  Procedure Laterality Date   ABDOMINAL HYSTERECTOMY      Family History  Problem Relation Age of Onset   Stroke Father     Medications- reviewed and updated Current Outpatient Medications  Medication Sig Dispense Refill   atorvastatin (LIPITOR) 20 MG tablet Take 1 tablet (20 mg total) by mouth every other day. TAKE 1 TABLET BY MOUTH DAILY 90 tablet 0   fluticasone (FLONASE) 50 MCG/ACT nasal spray Place 2 sprays into both nostrils daily. 16 g 11   levothyroxine (SYNTHROID) 75 MCG tablet TAKE 1 TABLET(75 MCG) BY MOUTH DAILY 90 tablet 3   calcium gluconate 500 MG tablet Take 1 tablet by mouth 3 (three) times daily. (Patient not taking: Reported  on 03/01/2022)     Multiple Vitamin (MULTIVITAMIN) tablet Take 1 tablet by mouth daily. (Patient not taking: Reported on 03/01/2022)     No current facility-administered medications for this visit.    Allergies-reviewed and updated No Known Allergies  Social History   Socioeconomic History   Marital status: Single    Spouse name: Not on file   Number of children: Not on file   Years of education: Not on file   Highest education level: Not on file  Occupational History   Occupation: wait person  Tobacco Use   Smoking status: Former    Packs/day: 1.00    Years: 50.00    Total pack years: 50.00    Types: Cigarettes    Quit date: 07/03/2015    Years since quitting: 7.1   Smokeless tobacco: Never  Vaping Use   Vaping Use: Never used   Substance and Sexual Activity   Alcohol use: Yes    Alcohol/week: 5.0 standard drinks of alcohol    Types: 5 Glasses of wine per week   Drug use: No   Sexual activity: Yes  Other Topics Concern   Not on file  Social History 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   Social Determinants of Health   Financial Resource Strain: Low Risk  (03/01/2022)   Overall Financial Resource Strain (CARDIA)    Difficulty of Paying Living Expenses: Not hard at all  Food Insecurity: No Food Insecurity (03/01/2022)   Hunger Vital Sign    Worried About Running Out of Food in the Last Year: Never true    Ran Out of Food in the Last Year: Never true  Transportation Needs: No Transportation Needs (03/01/2022)   PRAPARE - Hydrologist (Medical): No    Lack of Transportation (Non-Medical): No  Physical Activity: Sufficiently Active (03/01/2022)   Exercise Vital Sign    Days of Exercise per Week: 4 days    Minutes of Exercise per Session: 50 min  Stress: No Stress Concern Present (03/01/2022)   [place]    Feeling of Stress : Not at all  Social Connections: Moderately Integrated (03/01/2022)   Social Connection and Isolation Panel [NHANES]    Frequency of Communication with Friends and Family: Three times a week    Frequency of Social Gatherings with Friends and Family: Three times a week    Attends Religious Services: More than 4 times per year    Active Member of Clubs or Organizations: Yes    Attends Archivist Meetings: 1 to 4 times per year    Marital Status: Divorced          Objective:  Physical Exam: BP 131/73   Pulse (!) 59   Temp (!) 97.2 F (36.2 C) (Temporal)   Ht '5\' 4"'$  (1.626 m)   Wt 195 lb 3.2 oz (88.5 kg)   SpO2 97%   BMI 33.51 kg/m   Gen: No acute distress, resting comfortably CV: Regular rate and rhythm with no murmurs appreciated Pulm: Normal work  of breathing, clear to auscultation bilaterally with no crackles, wheezes, or rhonchi MSK - Legs: No deformities.  Right knee with crepitus on active and passive range of motion.  Stable to varus and valgus stress.  Anterior posterior drawer signs negative.  Neurovascular intact distally. Skin: Approximately 1 cm erythematous nodule on left posterior scalp.  Indurated.  Freely mobile.  No areas  of fluctuance.  No surrounding erythema. Neuro: Grossly normal, moves all extremities Psych: Normal affect and thought content  Time Spent: 45 minutes of total time was spent on the date of the encounter performing the following actions: chart review prior to seeing the patient including recent visits with previous PCP,  obtaining history, performing a medically necessary exam, counseling on the treatment plan, placing orders, and documenting in our EHR.        Algis Greenhouse. Jerline Pain, MD 08/08/2022 10:24 AM

## 2022-09-28 DIAGNOSIS — H2513 Age-related nuclear cataract, bilateral: Secondary | ICD-10-CM | POA: Diagnosis not present

## 2022-09-28 DIAGNOSIS — H524 Presbyopia: Secondary | ICD-10-CM | POA: Diagnosis not present

## 2022-09-28 DIAGNOSIS — H5203 Hypermetropia, bilateral: Secondary | ICD-10-CM | POA: Diagnosis not present

## 2022-09-28 DIAGNOSIS — D3132 Benign neoplasm of left choroid: Secondary | ICD-10-CM | POA: Diagnosis not present

## 2022-10-12 ENCOUNTER — Encounter: Payer: Self-pay | Admitting: Family Medicine

## 2022-10-12 ENCOUNTER — Ambulatory Visit (INDEPENDENT_AMBULATORY_CARE_PROVIDER_SITE_OTHER): Payer: Medicare Other | Admitting: Family Medicine

## 2022-10-12 VITALS — BP 128/77 | HR 55 | Temp 98.0°F | Ht 64.0 in | Wt 195.4 lb

## 2022-10-12 DIAGNOSIS — E039 Hypothyroidism, unspecified: Secondary | ICD-10-CM

## 2022-10-12 DIAGNOSIS — E789 Disorder of lipoprotein metabolism, unspecified: Secondary | ICD-10-CM | POA: Diagnosis not present

## 2022-10-12 DIAGNOSIS — Z1211 Encounter for screening for malignant neoplasm of colon: Secondary | ICD-10-CM | POA: Diagnosis not present

## 2022-10-12 DIAGNOSIS — Z0001 Encounter for general adult medical examination with abnormal findings: Secondary | ICD-10-CM | POA: Diagnosis not present

## 2022-10-12 DIAGNOSIS — Z87891 Personal history of nicotine dependence: Secondary | ICD-10-CM | POA: Diagnosis not present

## 2022-10-12 DIAGNOSIS — Z131 Encounter for screening for diabetes mellitus: Secondary | ICD-10-CM | POA: Diagnosis not present

## 2022-10-12 LAB — LIPID PANEL
Cholesterol: 196 mg/dL (ref 0–200)
HDL: 71.5 mg/dL (ref >39.00)
LDL Cholesterol: 107 mg/dL — ABNORMAL HIGH (ref 0–99)
NonHDL: 124.23
Total CHOL/HDL Ratio: 3
Triglycerides: 86 mg/dL (ref 0.0–149.0)
VLDL: 17.2 mg/dL (ref 0.0–40.0)

## 2022-10-12 LAB — COMPREHENSIVE METABOLIC PANEL
ALT: 10 U/L (ref 0–35)
AST: 15 U/L (ref 0–37)
Albumin: 4.2 g/dL (ref 3.5–5.2)
Alkaline Phosphatase: 53 U/L (ref 39–117)
BUN: 15 mg/dL (ref 6–23)
CO2: 29 mEq/L (ref 19–32)
Calcium: 9.2 mg/dL (ref 8.4–10.5)
Chloride: 103 mEq/L (ref 96–112)
Creatinine, Ser: 0.77 mg/dL (ref 0.40–1.20)
GFR: 73.99 mL/min (ref >60.00)
Glucose, Bld: 94 mg/dL (ref 70–99)
Potassium: 4.1 mEq/L (ref 3.5–5.1)
Sodium: 140 mEq/L (ref 135–145)
Total Bilirubin: 0.8 mg/dL (ref 0.2–1.2)
Total Protein: 6.9 g/dL (ref 6.0–8.3)

## 2022-10-12 LAB — TSH: TSH: 1.74 u[IU]/mL (ref 0.35–5.50)

## 2022-10-12 LAB — CBC
HCT: 40 % (ref 36.0–46.0)
Hemoglobin: 13.4 g/dL (ref 12.0–15.0)
MCHC: 33.5 g/dL (ref 30.0–36.0)
MCV: 95.3 fl (ref 78.0–100.0)
Platelets: 185 10*3/uL (ref 150.0–400.0)
RBC: 4.2 Mil/uL (ref 3.87–5.11)
RDW: 13.8 % (ref 11.5–15.5)
WBC: 5.7 10*3/uL (ref 4.0–10.5)

## 2022-10-12 LAB — HEMOGLOBIN A1C: Hgb A1c MFr Bld: 6.1 % (ref 4.6–6.5)

## 2022-10-12 NOTE — Assessment & Plan Note (Signed)
Check lipids.  Continue Lipitor 20 mg every other day.

## 2022-10-12 NOTE — Patient Instructions (Addendum)
It was very nice to see you today!  We will check blood work today.   We will refer you for lung cancer screening.  We will order Cologuard for colon cancer screening.  Please continue to work on diet and exercise.  I will see you back in a year for your next physical.  Come back sooner if needed.  Take care, Dr Jimmey RalphParker  PLEASE NOTE:  If you had any lab tests, please let us know if you have not heard back within a few days. You may see your results on mychart before we have a chance to review them but we will give you a call once they are reviewed by us.   If we ordered any referrals today, please let us know if you have not heard from their office within the next week.   If you had any urgent prescriptions sent in today, please check with the pharmacy within an hour of our visit to make sure the prescription was transmitted appropriately.   Please try these tips to maintain a healthy lifestyle:  Eat at least 3 REAL meals and 1-2 snacks per day.  Aim for no more than 5 hours between eating.  If you eat breakfast, please do so within one hour of getting up.   Each meal should contain half fruits/vegetables, one quarter protein, and one quarter carbs (no bigger than a computer mouse)  Cut down on sweet beverages. This includes juice, soda, and sweet tea.   Drink at least 1 glass of water with each meal and aim for at least 8 glasses per day  Exercise at least 150 minutes every week.   Preventive Care 6365 Years and Older, Female Preventive care refers to lifestyle choices and visits with your health care provider that can promote health and wellness. Preventive care visits are also called wellness exams. What can I expect for my preventive care visit? Counseling Your health care provider may ask you questions about your: Medical history, including: Past medical problems. Family medical history. Pregnancy and menstrual history. History of falls. Current health,  including: Memory and ability to understand (cognition). Emotional well-being. Home life and relationship well-being. Sexual activity and sexual health. Lifestyle, including: Alcohol, nicotine or tobacco, and drug use. Access to firearms. Diet, exercise, and sleep habits. Work and work Astronomerenvironment. Sunscreen use. Safety issues such as seatbelt and bike helmet use. Physical exam Your health care provider will check your: Height and weight. These may be used to calculate your BMI (body mass index). BMI is a measurement that tells if you are at a healthy weight. Waist circumference. This measures the distance around your waistline. This measurement also tells if you are at a healthy weight and may help predict your risk of certain diseases, such as type 2 diabetes and high blood pressure. Heart rate and blood pressure. Body temperature. Skin for abnormal spots. What immunizations do I need?  Vaccines are usually given at various ages, according to a schedule. Your health care provider will recommend vaccines for you based on your age, medical history, and lifestyle or other factors, such as travel or where you work. What tests do I need? Screening Your health care provider may recommend screening tests for certain conditions. This may include: Lipid and cholesterol levels. Hepatitis C test. Hepatitis B test. HIV (human immunodeficiency virus) test. STI (sexually transmitted infection) testing, if you are at risk. Lung cancer screening. Colorectal cancer screening. Diabetes screening. This is done by checking your blood sugar (glucose)  after you have not eaten for a while (fasting). Mammogram. Talk with your health care provider about how often you should have regular mammograms. BRCA-related cancer screening. This may be done if you have a family history of breast, ovarian, tubal, or peritoneal cancers. Bone density scan. This is done to screen for osteoporosis. Talk with your health  care provider about your test results, treatment options, and if necessary, the need for more tests. Follow these instructions at home: Eating and drinking  Eat a diet that includes fresh fruits and vegetables, whole grains, lean protein, and low-fat dairy products. Limit your intake of foods with high amounts of sugar, saturated fats, and salt. Take vitamin and mineral supplements as recommended by your health care provider. Do not drink alcohol if your health care provider tells you not to drink. If you drink alcohol: Limit how much you have to 0-1 drink a day. Know how much alcohol is in your drink. In the [place] one drink equals one 12 oz bottle of beer (355 mL), one 5 oz glass of wine (148 mL), or one 1 oz glass of hard liquor (44 mL). Lifestyle Brush your teeth every morning and night with fluoride toothpaste. Floss one time each day. Exercise for at least 30 minutes 5 or more days each week. Do not use any products that contain nicotine or tobacco. These products include cigarettes, chewing tobacco, and vaping devices, such as e-cigarettes. If you need help quitting, ask your health care provider. Do not use drugs. If you are sexually active, practice safe sex. Use a condom or other form of protection in order to prevent STIs. Take aspirin only as told by your health care provider. Make sure that you understand how much to take and what form to take. Work with your health care provider to find out whether it is safe and beneficial for you to take aspirin daily. Ask your health care provider if you need to take a cholesterol-lowering medicine (statin). Find healthy ways to manage stress, such as: Meditation, yoga, or listening to music. Journaling. Talking to a trusted person. Spending time with friends and family. Minimize exposure to UV radiation to reduce your risk of skin cancer. Safety Always wear your seat belt while driving or riding in a vehicle. Do not drive: If you have  been drinking alcohol. Do not ride with someone who has been drinking. When you are tired or distracted. While texting. If you have been using any mind-altering substances or drugs. Wear a helmet and other protective equipment during sports activities. If you have firearms in your house, make sure you follow all gun safety procedures. What's next? Visit your health care provider once a year for an annual wellness visit. Ask your health care provider how often you should have your eyes and teeth checked. Stay up to date on all vaccines. This information is not intended to replace advice given to you by your health care provider. Make sure you discuss any questions you have with your health care provider. Document Revised: 12/21/2020 Document Reviewed: 12/21/2020 Elsevier Patient Education  2023 [employer].

## 2022-10-12 NOTE — Assessment & Plan Note (Signed)
Check TSH.  Continue Synthroid 75 mcg daily. 

## 2022-10-12 NOTE — Progress Notes (Signed)
Chief Complaint:  Stephanie Carter is a 77 y.o. female who presents today for her annual comprehensive physical exam.    Assessment/Plan:  Chronic Problems Addressed Today: Lipid disorder Check lipids.  Continue Lipitor 20 mg every other day.  Hypothyroid Check TSH.  Continue Synthroid 75 mcg daily.  Former smoker Will refer for lung cancer screening.  Preventative Healthcare: Check labs.  Will refer for lung cancer screening.  She is due for colon cancer screening.  We discussed referral for colonoscopy however she wishes to pursue Cologuard at this time.  Will order this today.  She is aware that if this is positive she will need to be referred for colonoscopy.  Last colonoscopy was 5 years ago.  Patient Counseling(The following topics were reviewed and/or handout was given):  -Nutrition: Stressed importance of moderation in sodium/caffeine intake, saturated fat and cholesterol, caloric balance, sufficient intake of fresh fruits, vegetables, and fiber.  -Stressed the importance of regular exercise.   -Substance Abuse: Discussed cessation/primary prevention of tobacco, alcohol, or other drug use; driving or other dangerous activities under the influence; availability of treatment for abuse.   -Injury prevention: Discussed safety belts, safety helmets, smoke detector, smoking near bedding or upholstery.   -Sexuality: Discussed sexually transmitted diseases, partner selection, use of condoms, avoidance of unintended pregnancy and contraceptive alternatives.   -Dental health: Discussed importance of regular tooth brushing, flossing, and dental visits.  -Health maintenance and immunizations reviewed. Please refer to Health maintenance section.  Return to care in 1 year for next preventative visit.     Subjective:  HPI:  She has no acute complaints today.   Lifestyle Diet: Balanced. Plenty of fruits and vegetables.  Exercise: Walking      10/12/2022    9:47 AM  Depression  screen PHQ 2/9  Decreased Interest 0  Down, Depressed, Hopeless 0  PHQ - 2 Score 0    Health Maintenance Due  Topic Date Due   Lung Cancer Screening  Never done   DTaP/Tdap/Td (2 - Td or Tdap) 07/10/2019     ROS: Per HPI, otherwise a complete review of systems was negative.   PMH:  The following were reviewed and entered/updated in epic: Past Medical History:  Diagnosis Date   Allergy    Thyroid disease    Patient Active Problem List   Diagnosis Date Noted   Osteoarthritis 08/08/2022   Psoriasis 11/05/2013   Former smoker 11/05/2013   Lipid disorder 11/09/2011   Hypothyroid 11/09/2011   Past Surgical History:  Procedure Laterality Date   ABDOMINAL HYSTERECTOMY      Family History  Problem Relation Age of Onset   Stroke Father     Medications- reviewed and updated Current Outpatient Medications  Medication Sig Dispense Refill   atorvastatin (LIPITOR) 20 MG tablet Take 1 tablet (20 mg total) by mouth every other day. TAKE 1 TABLET BY MOUTH DAILY 90 tablet 0   fluticasone (FLONASE) 50 MCG/ACT nasal spray Place 2 sprays into both nostrils daily. 16 g 11   levothyroxine (SYNTHROID) 75 MCG tablet TAKE 1 TABLET(75 MCG) BY MOUTH DAILY 90 tablet 3   calcium gluconate 500 MG tablet Take 1 tablet by mouth 3 (three) times daily. (Patient not taking: Reported on 10/12/2022)     Multiple Vitamin (MULTIVITAMIN) tablet Take 1 tablet by mouth daily. (Patient not taking: Reported on 10/12/2022)     No current facility-administered medications for this visit.    Allergies-reviewed and updated No Known Allergies  Social History  Socioeconomic History   Marital status: Single    Spouse name: Not on file   Number of children: Not on file   Years of education: Not on file   Highest education level: Not on file  Occupational History   Occupation: wait person  Tobacco Use   Smoking status: Former    Packs/day: 1.00    Years: 50.00    Additional pack years: 0.00    Total pack  years: 50.00    Types: Cigarettes    Quit date: 07/03/2015    Years since quitting: 7.2   Smokeless tobacco: Never  Vaping Use   Vaping Use: Never used  Substance and Sexual Activity   Alcohol use: Yes    Alcohol/week: 5.0 standard drinks of alcohol    Types: 5 Glasses of wine per week   Drug use: No   Sexual activity: Yes  Other Topics Concern   Not on file  Social History 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   Social Determinants of Health   Financial Resource Strain: Low Risk  (03/01/2022)   Overall Financial Resource Strain (CARDIA)    Difficulty of Paying Living Expenses: Not hard at all  Food Insecurity: No Food Insecurity (03/01/2022)   Hunger Vital Sign    Worried About Running Out of Food in the Last Year: Never true    Ran Out of Food in the Last Year: Never true  Transportation Needs: No Transportation Needs (03/01/2022)   PRAPARE - Administrator, Civil ServiceTransportation    Lack of Transportation (Medical): No    Lack of Transportation (Non-Medical): No  Physical Activity: Sufficiently Active (03/01/2022)   Exercise Vital Sign    Days of Exercise per Week: 4 days    Minutes of Exercise per Session: 50 min  Stress: No Stress Concern Present (03/01/2022)   [employer] - Occupational Stress Questionnaire    Feeling of Stress : Not at all  Social Connections: Moderately Integrated (03/01/2022)   Social Connection and Isolation Panel [NHANES]    Frequency of Communication with Friends and Family: Three times a week    Frequency of Social Gatherings with Friends and Family: Three times a week    Attends Religious Services: More than 4 times per year    Active Member of Clubs or Organizations: Yes    Attends BankerClub or Organization Meetings: 1 to 4 times per year    Marital Status: Divorced        Objective:  Physical Exam: BP 128/77   Pulse (!) 55   Temp 98 F (36.7 C) (Temporal)   Ht 5\' 4"  (1.626 m)   Wt 195 lb 6.4 oz (88.6 kg)    SpO2 99%   BMI 33.54 kg/m   Body mass index is 33.54 kg/m. Wt Readings from Last 3 Encounters:  10/12/22 195 lb 6.4 oz (88.6 kg)  08/08/22 195 lb 3.2 oz (88.5 kg)  10/09/21 194 lb 6.4 oz (88.2 kg)   Gen: NAD, resting comfortably HEENT: TMs normal bilaterally. OP clear. No thyromegaly noted.  CV: RRR with no murmurs appreciated Pulm: NWOB, CTAB with no crackles, wheezes, or rhonchi GI: Normal bowel sounds present. Soft, Nontender, Nondistended. MSK: no edema, cyanosis, or clubbing noted Skin: warm, dry Neuro: CN2-12 grossly intact. Strength 5/5 in upper and lower extremities. Reflexes symmetric and intact bilaterally.  Psych: Normal affect and thought content     Jerrard Bradburn M. Jimmey RalphParker, MD 10/12/2022 10:35 AM

## 2022-10-12 NOTE — Assessment & Plan Note (Signed)
Will refer for  lung cancer screening 

## 2022-10-15 DIAGNOSIS — L821 Other seborrheic keratosis: Secondary | ICD-10-CM | POA: Diagnosis not present

## 2022-10-15 DIAGNOSIS — L570 Actinic keratosis: Secondary | ICD-10-CM | POA: Diagnosis not present

## 2022-10-15 NOTE — Progress Notes (Signed)
Please inform patient of the following:  Her cholesterol is up a little bit since last year. Her blood sugar is also borderline elevated. Everything else is stable. We can increase her atorvastatin to 40mg  daily if she is agreeable. Do not need to make any other changes to her treatment plan at this time. She should keep working on diet and exercise and we can recheck in a year or so.  Katina Degree. [name], MD 10/15/2022 1:27 PM

## 2022-10-16 ENCOUNTER — Other Ambulatory Visit: Payer: Self-pay | Admitting: *Deleted

## 2022-10-16 DIAGNOSIS — H65193 Other acute nonsuppurative otitis media, bilateral: Secondary | ICD-10-CM

## 2022-10-16 DIAGNOSIS — E785 Hyperlipidemia, unspecified: Secondary | ICD-10-CM

## 2022-10-16 DIAGNOSIS — E039 Hypothyroidism, unspecified: Secondary | ICD-10-CM

## 2022-10-16 MED ORDER — LEVOTHYROXINE SODIUM 75 MCG PO TABS: ORAL_TABLET | ORAL | 3 refills | Status: DC

## 2022-10-16 MED ORDER — ATORVASTATIN CALCIUM 20 MG PO TABS: 20.0000 mg | ORAL_TABLET | Freq: Every day | ORAL | 0 refills | Status: DC

## 2022-10-16 MED ORDER — FLUTICASONE PROPIONATE 50 MCG/ACT NA SUSP: 2.0000 | Freq: Every day | NASAL | 11 refills | Status: DC

## 2022-10-16 NOTE — Telephone Encounter (Signed)
Rx sent in.  Katina Degree. [name], MD 10/16/2022 12:51 PM

## 2022-10-16 NOTE — Telephone Encounter (Signed)
Last refill by Dr Chilton Si

## 2022-10-17 ENCOUNTER — Encounter: Payer: Self-pay | Admitting: Family Medicine

## 2022-11-19 LAB — COLOGUARD: COLOGUARD: NEGATIVE

## 2022-11-19 NOTE — Progress Notes (Signed)
Great news! Cologuard is negative. We can repeat again in 3 years.  Stephanie Carter. [name], MD 11/19/2022 10:02 AM

## 2023-02-19 ENCOUNTER — Other Ambulatory Visit: Payer: Self-pay | Admitting: Family Medicine

## 2023-02-19 ENCOUNTER — Telehealth: Payer: Self-pay | Admitting: Family Medicine

## 2023-02-19 DIAGNOSIS — E785 Hyperlipidemia, unspecified: Secondary | ICD-10-CM

## 2023-02-19 NOTE — Telephone Encounter (Signed)
Prescription Request  02/19/2023  LOV: 10/12/2022  What is the name of the medication or equipment? atorvastatin (LIPITOR) 20 MG tablet   Have you contacted your pharmacy to request a refill? Yes   Which pharmacy would you like this sent to?  [id number] [place], [place] - [address] [place] [address] Phone: [phone] Fax: [phone]    Patient notified that their request is being sent to the clinical staff for review and that they should receive a response within 2 business days.   Please advise at Mobile [phone] (mobile)

## 2023-02-20 ENCOUNTER — Other Ambulatory Visit: Payer: Self-pay | Admitting: *Deleted

## 2023-02-20 DIAGNOSIS — E785 Hyperlipidemia, unspecified: Secondary | ICD-10-CM

## 2023-02-20 MED ORDER — ATORVASTATIN CALCIUM 20 MG PO TABS
20.0000 mg | ORAL_TABLET | Freq: Every day | ORAL | 0 refills | Status: DC
Start: 2023-02-20 — End: 2023-04-13

## 2023-02-20 NOTE — Telephone Encounter (Signed)
Medication send to [facility]

## 2023-04-08 ENCOUNTER — Telehealth: Payer: Self-pay | Admitting: Family Medicine

## 2023-04-08 NOTE — Telephone Encounter (Signed)
Patient stated she was returning Karen's call in regards to AWV. Patient requests call back when able.

## 2023-04-12 ENCOUNTER — Encounter: Payer: Self-pay | Admitting: Family Medicine

## 2023-04-12 ENCOUNTER — Ambulatory Visit (INDEPENDENT_AMBULATORY_CARE_PROVIDER_SITE_OTHER): Payer: Medicare Other | Admitting: Family Medicine

## 2023-04-12 ENCOUNTER — Other Ambulatory Visit (INDEPENDENT_AMBULATORY_CARE_PROVIDER_SITE_OTHER): Payer: Medicare Other

## 2023-04-12 VITALS — Ht 64.0 in

## 2023-04-12 DIAGNOSIS — E789 Disorder of lipoprotein metabolism, unspecified: Secondary | ICD-10-CM

## 2023-04-12 LAB — LIPID PANEL
Cholesterol: 182 mg/dL (ref 0–200)
HDL: 73.5 mg/dL (ref >39.00)
LDL Cholesterol: 88 mg/dL (ref 0–99)
NonHDL: 108.34
Total CHOL/HDL Ratio: 2
Triglycerides: 100 mg/dL (ref 0.0–149.0)
VLDL: 20 mg/dL (ref 0.0–40.0)

## 2023-04-13 ENCOUNTER — Other Ambulatory Visit: Payer: Self-pay | Admitting: Family Medicine

## 2023-04-13 DIAGNOSIS — E785 Hyperlipidemia, unspecified: Secondary | ICD-10-CM

## 2023-04-15 MED ORDER — ATORVASTATIN CALCIUM 20 MG PO TABS
20.0000 mg | ORAL_TABLET | Freq: Every day | ORAL | 0 refills | Status: DC
Start: 2023-04-15 — End: 2023-09-27

## 2023-04-15 NOTE — Progress Notes (Signed)
Patient presented for lab visit. She was not seen or evaluated by me.  Stephanie Carter. [name], MD 04/15/2023 7:22 AM

## 2023-04-15 NOTE — Progress Notes (Signed)
Cholesterol levels are at goal.  She should continue current dose of Lipitor and we can recheck in 6 to 12 months.

## 2023-06-13 DIAGNOSIS — L03811 Cellulitis of head [any part, except face]: Secondary | ICD-10-CM | POA: Diagnosis not present

## 2023-06-13 DIAGNOSIS — L02811 Cutaneous abscess of head [any part, except face]: Secondary | ICD-10-CM | POA: Diagnosis not present

## 2023-06-20 ENCOUNTER — Ambulatory Visit (INDEPENDENT_AMBULATORY_CARE_PROVIDER_SITE_OTHER): Payer: Medicare Other | Admitting: Family Medicine

## 2023-06-20 VITALS — BP 139/80 | HR 62 | Temp 97.7°F | Ht 64.0 in | Wt 194.8 lb

## 2023-06-20 DIAGNOSIS — L409 Psoriasis, unspecified: Secondary | ICD-10-CM | POA: Diagnosis not present

## 2023-06-20 DIAGNOSIS — L720 Epidermal cyst: Secondary | ICD-10-CM

## 2023-06-20 MED ORDER — CLOBETASOL PROPIONATE 0.05 % EX SOLN: 1.0000 | Freq: Two times a day (BID) | CUTANEOUS | 0 refills | Status: DC

## 2023-06-20 NOTE — Assessment & Plan Note (Signed)
Not controlled.  Has previously seen dermatology for this however has not seen them in quite a while.  We did discuss referral to dermatology and she will look into establishing with one but does not need referral today.  We will start topical clobetasol.  She is aware that she can only use this up to 2 weeks.  She will let us know if not improving.

## 2023-06-20 NOTE — Progress Notes (Signed)
   Stephanie Carter is a 77 y.o. female who presents today for an office visit.  Assessment/Plan:  New/Acute Problems: Epidermal inclusion cyst No signs of infection today.  She has completed her course of antibiotics.  It is okay for her to resume her normal skin hygiene.  We did discuss referral to dermatology for definitive excision of cyst however she would like to defer for now.  She will let us know if she changes her mind.  She will let us know if she has any recurrence.  Chronic Problems Addressed Today: Psoriasis Not controlled.  Has previously seen dermatology for this however has not seen them in quite a while.  We did discuss referral to dermatology and she will look into establishing with one but does not need referral today.  We will start topical clobetasol.  She is aware that she can only use this up to 2 weeks.  She will let us know if not improving.     Subjective:  HPI:  See Assessment / plan for status of chronic conditions.  Patient is here today for urgent care follow-up. Went to urgent care a week ago with painful lump on the back of her scalp.  Diagnosed with abscess.  Underwent I&D and started on doxycycline.  Symptoms have resolved.  She has had a mild flare she thinks for quite a while but only recently became an issue.       Objective:  Physical Exam: BP 139/80   Pulse 62   Temp 97.7 F (36.5 C) (Temporal)   Ht 5\' 4"  (1.626 m)   Wt 194 lb 12.8 oz (88.4 kg)   SpO2 96%   BMI 33.44 kg/m   Gen: No acute distress, resting comfortably HEENT: Approximately 1 cm firm nodule on posterior scalp consistent with inclusion cyst.  No surrounding erythema.  No drainage.  No pain on palpation.  Skin: Scattered erythematous plaques noted across scalp. Neuro: Grossly normal, moves all extremities Psych: Normal affect and thought content      Milisa Kimbell M. [name], MD 06/20/2023 12:58 PM

## 2023-07-16 ENCOUNTER — Ambulatory Visit: Payer: Medicare Other

## 2023-07-16 VITALS — Wt 189.0 lb

## 2023-07-16 DIAGNOSIS — Z0000 Encounter for general adult medical examination without abnormal findings: Secondary | ICD-10-CM | POA: Diagnosis not present

## 2023-07-16 DIAGNOSIS — Z139 Encounter for screening, unspecified: Secondary | ICD-10-CM

## 2023-07-16 NOTE — Progress Notes (Signed)
 Subjective:   Stephanie Carter is a 78 y.o. female who presents for Medicare Annual (Subsequent) preventive examination.  Visit Complete: Virtual I connected with  [name] on 07/16/23 by a audio enabled telemedicine application and verified that I am speaking with the correct person using two identifiers.  Patient Location: Home  Provider Location: Office/Clinic  I discussed the limitations of evaluation and management by telemedicine. The patient expressed understanding and agreed to proceed.  Vital Signs: Because this visit was a virtual/telehealth visit, some criteria may be missing or patient reported. Any vitals not documented were not able to be obtained and vitals that have been documented are patient reported.   Cardiac Risk Factors include: advanced age (>7men, >32 women);obesity (BMI >30kg/m2);dyslipidemia     Objective:    Today's Vitals   07/16/23 1341  Weight: 189 lb (85.7 kg)   Body mass index is 32.44 kg/m.     07/16/2023    1:47 PM 03/01/2022    8:51 AM 09/16/2020    3:02 PM 10/30/2019    2:05 PM 06/10/2019   10:20 AM 05/01/2018    9:09 AM 02/12/2017   10:05 AM  Advanced Directives  Does Patient Have a Medical Advance Directive? Yes Yes Yes Yes Yes Yes Yes  Type of Estate Agent of [place];Living will Healthcare Power of [place];Living will Living will;Healthcare Power of [employer] Power of [place];Living will Healthcare Power of [place];Living will Living will Living will  Does patient want to make changes to medical advance directive?    No - Patient declined Yes (MAU/Ambulatory/Procedural Areas - Information given)  No - Patient declined  Copy of Healthcare Power of Attorney in Chart? No - copy requested No - copy requested No - copy requested No - copy requested     Would patient like information on creating a medical advance directive?    No - Patient declined       Current Medications (verified) Outpatient  Encounter Medications as of 07/16/2023  Medication Sig   atorvastatin  (LIPITOR) 20 MG tablet Take 1 tablet (20 mg total) by mouth daily.   b complex vitamins capsule Take 1 capsule by mouth daily.   clobetasol  (TEMOVATE ) 0.05 % external solution Apply 1 Application topically 2 (two) times daily.   fluticasone  (FLONASE ) 50 MCG/ACT nasal spray Place 2 sprays into both nostrils daily.   levothyroxine  (SYNTHROID ) 75 MCG tablet TAKE 1 TABLET(75 MCG) BY MOUTH DAILY   [DISCONTINUED] doxycycline  (VIBRAMYCIN ) 100 MG capsule SMARTSIG:1.0 Capsule(s) By Mouth Twice Daily   [DISCONTINUED] Multiple Vitamin (MULTIVITAMIN) tablet Take 1 tablet by mouth daily.   No facility-administered encounter medications on file as of 07/16/2023.    Allergies (verified) Patient has no known allergies.   History: Past Medical History:  Diagnosis Date   Allergy    Thyroid  disease    Past Surgical History:  Procedure Laterality Date   ABDOMINAL HYSTERECTOMY     Family History  Problem Relation Age of Onset   Stroke Father    Social History   Socioeconomic History   Marital status: Single    Spouse name: Not on file   Number of children: Not on file   Years of education: Not on file   Highest education level: Not on file  Occupational History   Occupation: wait person  Tobacco Use   Smoking status: Former    Current packs/day: 0.00    Average packs/day: 1 pack/day for 50.0 years (50.0 ttl pk-yrs)    Types:  Cigarettes    Start date: 07/02/1965    Quit date: 07/03/2015    Years since quitting: 8.0   Smokeless tobacco: Never  Vaping Use   Vaping status: Never Used  Substance and Sexual Activity   Alcohol use: Yes    Alcohol/week: 5.0 standard drinks of alcohol    Types: 5 Glasses of wine per week   Drug use: No   Sexual activity: Yes  Other Topics Concern   Not on file  Social History 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   Social Drivers of Health   Financial Resource  Strain: Low Risk  (07/16/2023)   Overall Financial Resource Strain (CARDIA)    Difficulty of Paying Living Expenses: Not hard at all  Food Insecurity: No Food Insecurity (07/16/2023)   Hunger Vital Sign    Worried About Running Out of Food in the Last Year: Never true    Ran Out of Food in the Last Year: Never true  Transportation Needs: No Transportation Needs (07/16/2023)   PRAPARE - Administrator, Civil Service (Medical): No    Lack of Transportation (Non-Medical): No  Physical Activity: Sufficiently Active (07/16/2023)   Exercise Vital Sign    Days of Exercise per Week: 4 days    Minutes of Exercise per Session: 60 min  Stress: No Stress Concern Present (07/16/2023)   [employer] of Occupational Health - Occupational Stress Questionnaire    Feeling of Stress : Not at all  Social Connections: Moderately Isolated (07/16/2023)   Social Connection and Isolation Panel [NHANES]    Frequency of Communication with Friends and Family: More than three times a week    Frequency of Social Gatherings with Friends and Family: More than three times a week    Attends Religious Services: 1 to 4 times per year    Active Member of [employer] or Organizations: No    Attends Engineer, Structural: Never    Marital Status: Never married    Tobacco Counseling Counseling given: Not Answered   Clinical Intake:  Pre-visit preparation completed: Yes  Pain : No/denies pain     BMI - recorded: 32.44 Nutritional Status: BMI > 30  Obese Nutritional Risks: None Diabetes: No  How often do you need to have someone help you when you read instructions, pamphlets, or other written materials from your doctor or pharmacy?: 1 - Never  Interpreter Needed?: No  Information entered by :: Ellouise Haws, LPN   Activities of Daily Living    07/16/2023    1:43 PM  In your present state of health, do you have any difficulty performing the following activities:  Hearing? 0  Vision? 0   Difficulty concentrating or making decisions? 0  Walking or climbing stairs? 0  Dressing or bathing? 0  Doing errands, shopping? 0  Preparing Food and eating ? N  Using the Toilet? N  In the past six months, have you accidently leaked urine? N  Do you have problems with loss of bowel control? N  Managing your Medications? N  Managing your Finances? N  Housekeeping or managing your Housekeeping? N    Patient Care Team: Kennyth Worth HERO, MD as PCP - General (Family Medicine)  Indicate any recent Medical Services you may have received from other than [facility] providers in the past year (date may be approximate).     Assessment:   This is a routine wellness examination for Col[place].  Hearing/Vision screen Hearing Screening -  Comments:: Pt denies any hearing concerns  Vision Screening - Comments:: Pt stated she follows up with a provider    Goals Addressed               This Visit's Progress     Patient Stated (pt-stated)        Lose weight        Depression Screen    07/16/2023    1:45 PM 06/20/2023   11:52 AM 10/12/2022    9:47 AM 08/08/2022    9:37 AM 03/01/2022    8:52 AM 03/01/2022    8:50 AM 10/09/2021    9:34 AM  PHQ 2/9 Scores  PHQ - 2 Score 0 0 0 0 0 0 0    Fall Risk    07/16/2023    1:48 PM 06/20/2023   11:53 AM 10/12/2022    9:47 AM 08/08/2022    9:37 AM 03/01/2022    8:52 AM  Fall Risk   Falls in the past year? 0 0 0 0 0  Number falls in past yr: 0 0 0 0 0  Injury with Fall? 0 0 0 0 0  Risk for fall due to : Impaired balance/gait No Fall Risks No Fall Risks No Fall Risks   Follow up Falls prevention discussed    Falls evaluation completed;Education provided    MEDICARE RISK AT HOME: Medicare Risk at Home Any stairs in or around the home?: No If so, are there any without handrails?: No Home free of loose throw rugs in walkways, pet beds, electrical cords, etc?: Yes Adequate lighting in your home to reduce risk of falls?: Yes Life alert?: No Use of a cane,  walker or w/c?: No Grab bars in the bathroom?: No Shower chair or bench in shower?: No Elevated toilet seat or a handicapped toilet?: No  TIMED UP AND GO:  Was the test performed?  No    Cognitive Function:        07/16/2023    1:49 PM 10/09/2021    9:42 AM 09/16/2020    3:00 PM 06/10/2019   10:17 AM 05/01/2018    9:09 AM  6CIT Screen  What Year? 0 points 0 points 0 points 0 points 0 points  What month? 0 points 0 points 0 points 0 points 0 points  What time? 0 points 0 points 0 points 0 points 0 points  Count back from 20 0 points 0 points 0 points 0 points 0 points  Months in reverse 0 points 0 points 0 points 0 points 0 points  Repeat phrase 0 points 0 points 0 points 0 points 0 points  Total Score 0 points 0 points 0 points 0 points 0 points    Immunizations Immunization History  Administered Date(s) Administered   Fluad Quad(high Dose 65+) 06/10/2019, 07/07/2022   Influenza Split 06/12/2012, 03/25/2013   Influenza, High Dose Seasonal PF 05/01/2018, 04/13/2023   Influenza-Unspecified 06/10/2016, 04/23/2017, 08/01/2020, 05/10/2021, 07/07/2022   Moderna Covid-19 Fall Seasonal Vaccine 70yrs & older 04/13/2023   PFIZER(Purple Top)SARS-COV-2 Vaccination 08/15/2019, 09/05/2019, 05/20/2020, 02/03/2021, 05/10/2021   Pfizer(Comirnaty)Fall Seasonal Vaccine 12 years and older 04/12/2022   Pneumococcal Conjugate-13 12/22/2015   Pneumococcal Polysaccharide-23 02/12/2017   Pneumococcal-Unspecified 07/09/2009   Tdap 07/09/2009, 07/07/2022   Zoster Recombinant(Shingrix) 10/17/2022, 03/20/2023    TDAP status: Up to date  Flu Vaccine status: Up to date  Pneumococcal vaccine status: Up to date  Covid-19 vaccine status: Completed vaccines  Qualifies for Shingles Vaccine? Yes   Zostavax completed  Yes   Shingrix Completed?: Yes  Screening Tests Health Maintenance  Topic Date Due   Lung Cancer Screening  Never done   Medicare Annual Wellness (AWV)  07/15/2024   DTaP/Tdap/Td (3 -  Td or Tdap) 07/07/2032   Pneumonia Vaccine 42+ Years old  Completed   INFLUENZA VACCINE  Completed   DEXA SCAN  Completed   COVID-19 Vaccine  Completed   Hepatitis C Screening  Completed   Zoster Vaccines- Shingrix  Completed   HPV VACCINES  Aged Out   Colonoscopy  Discontinued    Health Maintenance  Health Maintenance Due  Topic Date Due   Lung Cancer Screening  Never done    Colorectal cancer screening: No longer required.   Mammogram status: Completed 03/01/16. Repeat every year  Bone Density status: Completed 06/23/17. Results reflect: Bone density results: OSTEOPENIA. Repeat every 2 years.  Lung Cancer Screening: (Low Dose CT Chest recommended if Age 6-80 years, 20 pack-year currently smoking OR have quit w/in 15years.) does qualify.   Lung Cancer Screening Referral: placed 07/16/23  Additional Screening:  Hepatitis C Screening: Completed 05/03/14  Vision Screening: Recommended annual ophthalmology exams for early detection of glaucoma and other disorders of the eye. Is the patient up to date with their annual eye exam?  Yes  Who is the provider or what is the name of the office in which the patient attends annual eye exams? Unsure of providers name  If pt is not established with a provider, would they like to be referred to a provider to establish care? No .   Dental Screening: Recommended annual dental exams for proper oral hygiene   Community Resource Referral / Chronic Care Management: CRR required this visit?  No   CCM required this visit?  No     Plan:     I have personally reviewed and noted the following in the patient's chart:   Medical and social history Use of alcohol, tobacco or illicit drugs  Current medications and supplements including opioid prescriptions. Patient is not currently taking opioid prescriptions. Functional ability and status Nutritional status Physical activity Advanced directives List of other physicians Hospitalizations,  surgeries, and ER visits in previous 12 months Vitals Screenings to include cognitive, depression, and falls Referrals and appointments  In addition, I have reviewed and discussed with patient certain preventive protocols, quality metrics, and best practice recommendations. A written personalized care plan for preventive services as well as general preventive health recommendations were provided to patient.     Ellouise VEAR Haws, LPN   02/07/7973   After Visit Summary: (MyChart) Due to this being a telephonic visit, the after visit summary with patients personalized plan was offered to patient via MyChart   Nurse Notes: none

## 2023-07-16 NOTE — Patient Instructions (Signed)
 Ms. Dusenbury , Thank you for taking time to come for your Medicare Wellness Visit. I appreciate your ongoing commitment to your health goals. Please review the following plan we discussed and let me know if I can assist you in the future.   Referrals/Orders/Follow-Ups/Clinician Recommendations: Aim for 30 minutes of exercise or brisk walking, 6-8 glasses of water, and 5 servings of fruits and vegetables each day.   This is a list of the screening recommended for you and due dates:  Health Maintenance  Topic Date Due   Screening for Lung Cancer  Never done   Medicare Annual Wellness Visit  03/02/2023   DTaP/Tdap/Td vaccine (3 - Td or Tdap) 07/07/2032   Pneumonia Vaccine  Completed   Flu Shot  Completed   DEXA scan (bone density measurement)  Completed   COVID-19 Vaccine  Completed   Hepatitis C Screening  Completed   Zoster (Shingles) Vaccine  Completed   HPV Vaccine  Aged Out   Colon Cancer Screening  Discontinued    Advanced directives: (Copy Requested) Please bring a copy of your health care power of attorney and living will to the office to be added to your chart at your convenience.  Next Medicare Annual Wellness Visit scheduled for next year: Yes

## 2023-09-27 ENCOUNTER — Other Ambulatory Visit: Payer: Self-pay | Admitting: Family Medicine

## 2023-09-27 DIAGNOSIS — E785 Hyperlipidemia, unspecified: Secondary | ICD-10-CM

## 2023-09-27 MED ORDER — ATORVASTATIN CALCIUM 20 MG PO TABS
20.0000 mg | ORAL_TABLET | Freq: Every day | ORAL | 0 refills | Status: DC
Start: 2023-09-27 — End: 2023-12-13

## 2023-10-25 ENCOUNTER — Other Ambulatory Visit: Payer: Self-pay | Admitting: Family Medicine

## 2023-10-25 DIAGNOSIS — E039 Hypothyroidism, unspecified: Secondary | ICD-10-CM

## 2023-12-13 ENCOUNTER — Encounter: Payer: Self-pay | Admitting: Family Medicine

## 2023-12-13 ENCOUNTER — Ambulatory Visit (INDEPENDENT_AMBULATORY_CARE_PROVIDER_SITE_OTHER): Payer: Medicare Other | Admitting: Family Medicine

## 2023-12-13 VITALS — BP 122/75 | HR 56 | Temp 97.5°F | Ht 64.0 in | Wt 192.8 lb

## 2023-12-13 DIAGNOSIS — R739 Hyperglycemia, unspecified: Secondary | ICD-10-CM

## 2023-12-13 DIAGNOSIS — Z0001 Encounter for general adult medical examination with abnormal findings: Secondary | ICD-10-CM | POA: Diagnosis not present

## 2023-12-13 DIAGNOSIS — E785 Hyperlipidemia, unspecified: Secondary | ICD-10-CM | POA: Diagnosis not present

## 2023-12-13 DIAGNOSIS — H65193 Other acute nonsuppurative otitis media, bilateral: Secondary | ICD-10-CM

## 2023-12-13 DIAGNOSIS — L409 Psoriasis, unspecified: Secondary | ICD-10-CM | POA: Diagnosis not present

## 2023-12-13 DIAGNOSIS — M1990 Unspecified osteoarthritis, unspecified site: Secondary | ICD-10-CM | POA: Diagnosis not present

## 2023-12-13 DIAGNOSIS — E039 Hypothyroidism, unspecified: Secondary | ICD-10-CM | POA: Diagnosis not present

## 2023-12-13 DIAGNOSIS — E789 Disorder of lipoprotein metabolism, unspecified: Secondary | ICD-10-CM | POA: Diagnosis not present

## 2023-12-13 LAB — CBC
HCT: 40.2 % (ref 36.0–46.0)
Hemoglobin: 13.4 g/dL (ref 12.0–15.0)
MCHC: 33.2 g/dL (ref 30.0–36.0)
MCV: 95.7 fl (ref 78.0–100.0)
Platelets: 201 10*3/uL (ref 150.0–400.0)
RBC: 4.2 Mil/uL (ref 3.87–5.11)
RDW: 13.8 % (ref 11.5–15.5)
WBC: 4.8 10*3/uL (ref 4.0–10.5)

## 2023-12-13 LAB — COMPREHENSIVE METABOLIC PANEL WITH GFR
ALT: 13 U/L (ref 0–35)
AST: 23 U/L (ref 0–37)
Albumin: 4.2 g/dL (ref 3.5–5.2)
Alkaline Phosphatase: 46 U/L (ref 39–117)
BUN: 19 mg/dL (ref 6–23)
CO2: 31 meq/L (ref 19–32)
Calcium: 9.4 mg/dL (ref 8.4–10.5)
Chloride: 103 meq/L (ref 96–112)
Creatinine, Ser: 0.87 mg/dL (ref 0.40–1.20)
GFR: 63.38 mL/min (ref >60.00)
Glucose, Bld: 99 mg/dL (ref 70–99)
Potassium: 4 meq/L (ref 3.5–5.1)
Sodium: 139 meq/L (ref 135–145)
Total Bilirubin: 0.7 mg/dL (ref 0.2–1.2)
Total Protein: 7.1 g/dL (ref 6.0–8.3)

## 2023-12-13 LAB — LIPID PANEL
Cholesterol: 168 mg/dL (ref 0–200)
HDL: 71.4 mg/dL (ref >39.00)
LDL Cholesterol: 77 mg/dL (ref 0–99)
NonHDL: 96.77
Total CHOL/HDL Ratio: 2
Triglycerides: 100 mg/dL (ref 0.0–149.0)
VLDL: 20 mg/dL (ref 0.0–40.0)

## 2023-12-13 LAB — HEMOGLOBIN A1C: Hgb A1c MFr Bld: 6.2 % (ref 4.6–6.5)

## 2023-12-13 MED ORDER — CLOBETASOL PROPIONATE 0.05 % EX SOLN: 1.0000 | Freq: Two times a day (BID) | CUTANEOUS | 5 refills | Status: AC

## 2023-12-13 MED ORDER — LEVOTHYROXINE SODIUM 75 MCG PO TABS: 75.0000 ug | ORAL_TABLET | Freq: Every day | ORAL | 3 refills | Status: DC

## 2023-12-13 MED ORDER — ATORVASTATIN CALCIUM 20 MG PO TABS: 20.0000 mg | ORAL_TABLET | Freq: Every day | ORAL | 3 refills | Status: AC

## 2023-12-13 MED ORDER — FLUTICASONE PROPIONATE 50 MCG/ACT NA SUSP: 2.0000 | Freq: Every day | NASAL | 11 refills | Status: AC

## 2023-12-13 NOTE — Assessment & Plan Note (Signed)
 Stable on clobetasol  as needed.  Will refill today.  We again reiterated she should only use this for up to 1 to 2 weeks at a time.  She will let us  know if she has any issues or symptoms persist and we can refer to dermatology as we discussed at previous office visits.

## 2023-12-13 NOTE — Assessment & Plan Note (Signed)
 Check lipids.  She is on Lipitor 20 mg daily.  Tolerating well.

## 2023-12-13 NOTE — Progress Notes (Signed)
 Chief Complaint:  Stephanie Carter is a 78 y.o. female who presents today for her annual comprehensive physical exam.    Assessment/Plan:  Chronic Problems Addressed Today: Lipid disorder Check lipids.  She is on Lipitor 20 mg daily.  Tolerating well.  Hypothyroid On Synthroid  75 mcg daily.  Tolerating well.  Check TSH today.  Psoriasis Stable on clobetasol  as needed.  Will refill today.  We again reiterated she should only use this for up to 1 to 2 weeks at a time.  She will let us  know if she has any issues or symptoms persist and we can refer to dermatology as we discussed at previous office visits.  Preventative Healthcare: Check labs.  No longer needs colon cancer screening due to age.  We referred for lung cancer screening last year however she was declined due to age.  Patient Counseling(The following topics were reviewed and/or handout was given):  -Nutrition: Stressed importance of moderation in sodium/caffeine intake, saturated fat and cholesterol, caloric balance, sufficient intake of fresh fruits, vegetables, and fiber.  -Stressed the importance of regular exercise.   -Substance Abuse: Discussed cessation/primary prevention of tobacco, alcohol, or other drug use; driving or other dangerous activities under the influence; availability of treatment for abuse.   -Injury prevention: Discussed safety belts, safety helmets, smoke detector, smoking near bedding or upholstery.   -Sexuality: Discussed sexually transmitted diseases, partner selection, use of condoms, avoidance of unintended pregnancy and contraceptive alternatives.   -Dental health: Discussed importance of regular tooth brushing, flossing, and dental visits.  -Health maintenance and immunizations reviewed. Please refer to Health maintenance section.  Return to care in 1 year for next preventative visit.     Subjective:  HPI:  She has no acute complaints today.  See A/P for status of chronic  conditions.  Lifestyle Diet: Balanced. Plenty of fruits and vegetables.  Exercise: Water exercises.      12/13/2023    9:00 AM  Depression screen PHQ 2/9  Decreased Interest 0  Down, Depressed, Hopeless 0  PHQ - 2 Score 0    Health Maintenance Due  Topic Date Due   Lung Cancer Screening  Never done     ROS: Per HPI, otherwise a complete review of systems was negative.   PMH:  The following were reviewed and entered/updated in epic: Past Medical History:  Diagnosis Date   Allergy    Thyroid  disease    Patient Active Problem List   Diagnosis Date Noted   Osteoarthritis 08/08/2022   Psoriasis 11/05/2013   Former smoker 11/05/2013   Lipid disorder 11/09/2011   Hypothyroid 11/09/2011   Past Surgical History:  Procedure Laterality Date   ABDOMINAL HYSTERECTOMY      Family History  Problem Relation Age of Onset   Stroke Father     Medications- reviewed and updated Current Outpatient Medications  Medication Sig Dispense Refill   b complex vitamins capsule Take 1 capsule by mouth daily.     atorvastatin  (LIPITOR) 20 MG tablet Take 1 tablet (20 mg total) by mouth daily. 90 tablet 3   clobetasol  (TEMOVATE ) 0.05 % external solution Apply 1 Application topically 2 (two) times daily. 50 mL 5   fluticasone  (FLONASE ) 50 MCG/ACT nasal spray Place 2 sprays into both nostrils daily. 16 g 11   levothyroxine  (SYNTHROID ) 75 MCG tablet Take 1 tablet (75 mcg total) by mouth daily. 90 tablet 3   No current facility-administered medications for this visit.    Allergies-reviewed and updated No Known Allergies  Social History   Socioeconomic History   Marital status: Single    Spouse name: Not on file   Number of children: Not on file   Years of education: Not on file   Highest education level: Bachelor's degree (e.g., BA, AB, BS)  Occupational History   Occupation: wait person  Tobacco Use   Smoking status: Former    Current packs/day: 0.00    Average packs/day: 1  pack/day for 50.0 years (50.0 ttl pk-yrs)    Types: Cigarettes    Start date: 07/02/1965    Quit date: 07/03/2015    Years since quitting: 8.4   Smokeless tobacco: Never  Vaping Use   Vaping status: Never Used  Substance and Sexual Activity   Alcohol use: Yes    Alcohol/week: 5.0 standard drinks of alcohol    Types: 5 Glasses of wine per week   Drug use: No   Sexual activity: Yes  Other Topics Concern   Not on file  Social History 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   Social Drivers of Health   Financial Resource Strain: Low Risk  (12/11/2023)   Overall Financial Resource Strain (CARDIA)    Difficulty of Paying Living Expenses: Not hard at all  Food Insecurity: No Food Insecurity (12/11/2023)   Hunger Vital Sign    Worried About Running Out of Food in the Last Year: Never true    Ran Out of Food in the Last Year: Never true  Transportation Needs: No Transportation Needs (12/11/2023)   PRAPARE - Administrator, Civil Service (Medical): No    Lack of Transportation (Non-Medical): No  Physical Activity: Sufficiently Active (12/11/2023)   Exercise Vital Sign    Days of Exercise per Week: 5 days    Minutes of Exercise per Session: 150+ min  Stress: No Stress Concern Present (12/11/2023)   [employer] of Occupational Health - Occupational Stress Questionnaire    Feeling of Stress : Not at all  Social Connections: Moderately Integrated (12/11/2023)   Social Connection and Isolation Panel [NHANES]    Frequency of Communication with Friends and Family: More than three times a week    Frequency of Social Gatherings with Friends and Family: Three times a week    Attends Religious Services: More than 4 times per year    Active Member of Clubs or Organizations: Yes    Attends Engineer, structural: More than 4 times per year    Marital Status: Divorced        Objective:  Physical Exam: BP 122/75   Pulse (!) 56   Temp (!) 97.5 F (36.4  C) (Temporal)   Ht 5\' 4"  (1.626 m)   Wt 192 lb 12.8 oz (87.5 kg)   SpO2 95%   BMI 33.09 kg/m   Body mass index is 33.09 kg/m. Wt Readings from Last 3 Encounters:  12/13/23 192 lb 12.8 oz (87.5 kg)  07/16/23 189 lb (85.7 kg)  06/20/23 194 lb 12.8 oz (88.4 kg)   Gen: NAD, resting comfortably HEENT: TMs normal bilaterally. OP clear. No thyromegaly noted.  CV: RRR with no murmurs appreciated Pulm: NWOB, CTAB with no crackles, wheezes, or rhonchi GI: Normal bowel sounds present. Soft, Nontender, Nondistended. MSK: no edema, cyanosis, or clubbing noted Skin: warm, dry Neuro: CN2-12 grossly intact. Strength 5/5 in upper and lower extremities. Reflexes symmetric and intact bilaterally.  Psych: Normal affect and thought content     Casondra Gasca M. Daneil Dunker, MD  12/13/2023 9:25 AM

## 2023-12-13 NOTE — Patient Instructions (Signed)
 It was very nice to see you today!  We will check blood work today.  I will refill your clobetasol .  Please continue to work on diet and exercise.  I will see you back in 1 year for your next physical.  Come back sooner if needed.  Return in about 1 year (around 12/12/2024) for Annual Physical.   Take care, Dr Daneil Dunker  PLEASE NOTE:  If you had any lab tests, please let us  know if you have not heard back within a few days. You may see your results on mychart before we have a chance to review them but we will give you a call once they are reviewed by us .   If we ordered any referrals today, please let us  know if you have not heard from their office within the next week.   If you had any urgent prescriptions sent in today, please check with the pharmacy within an hour of our visit to make sure the prescription was transmitted appropriately.   Please try these tips to maintain a healthy lifestyle:  Eat at least 3 REAL meals and 1-2 snacks per day.  Aim for no more than 5 hours between eating.  If you eat breakfast, please do so within one hour of getting up.   Each meal should contain half fruits/vegetables, one quarter protein, and one quarter carbs (no bigger than a computer mouse)  Cut down on sweet beverages. This includes juice, soda, and sweet tea.   Drink at least 1 glass of water with each meal and aim for at least 8 glasses per day  Exercise at least 150 minutes every week.    Preventive Care 42 Years and Older, Female Preventive care refers to lifestyle choices and visits with your health care provider that can promote health and wellness. Preventive care visits are also called wellness exams. What can I expect for my preventive care visit? Counseling Your health care provider may ask you questions about your: Medical history, including: Past medical problems. Family medical history. Pregnancy and menstrual history. History of falls. Current health, including: Memory  and ability to understand (cognition). Emotional well-being. Home life and relationship well-being. Sexual activity and sexual health. Lifestyle, including: Alcohol, nicotine or tobacco, and drug use. Access to firearms. Diet, exercise, and sleep habits. Work and work Astronomer. Sunscreen use. Safety issues such as seatbelt and bike helmet use. Physical exam Your health care provider will check your: Height and weight. These may be used to calculate your BMI (body mass index). BMI is a measurement that tells if you are at a healthy weight. Waist circumference. This measures the distance around your waistline. This measurement also tells if you are at a healthy weight and may help predict your risk of certain diseases, such as type 2 diabetes and high blood pressure. Heart rate and blood pressure. Body temperature. Skin for abnormal spots. What immunizations do I need?  Vaccines are usually given at various ages, according to a schedule. Your health care provider will recommend vaccines for you based on your age, medical history, and lifestyle or other factors, such as travel or where you work. What tests do I need? Screening Your health care provider may recommend screening tests for certain conditions. This may include: Lipid and cholesterol levels. Hepatitis C test. Hepatitis B test. HIV (human immunodeficiency virus) test. STI (sexually transmitted infection) testing, if you are at risk. Lung cancer screening. Colorectal cancer screening. Diabetes screening. This is done by checking your blood sugar (glucose)  after you have not eaten for a while (fasting). Mammogram. Talk with your health care provider about how often you should have regular mammograms. BRCA-related cancer screening. This may be done if you have a family history of breast, ovarian, tubal, or peritoneal cancers. Bone density scan. This is done to screen for osteoporosis. Talk with your health care provider  about your test results, treatment options, and if necessary, the need for more tests. Follow these instructions at home: Eating and drinking  Eat a diet that includes fresh fruits and vegetables, whole grains, lean protein, and low-fat dairy products. Limit your intake of foods with high amounts of sugar, saturated fats, and salt. Take vitamin and mineral supplements as recommended by your health care provider. Do not drink alcohol if your health care provider tells you not to drink. If you drink alcohol: Limit how much you have to 0-1 drink a day. Know how much alcohol is in your drink. In the [place] one drink equals one 12 oz bottle of beer (355 mL), one 5 oz glass of wine (148 mL), or one 1 oz glass of hard liquor (44 mL). Lifestyle Brush your teeth every morning and night with fluoride toothpaste. Floss one time each day. Exercise for at least 30 minutes 5 or more days each week. Do not use any products that contain nicotine or tobacco. These products include cigarettes, chewing tobacco, and vaping devices, such as e-cigarettes. If you need help quitting, ask your health care provider. Do not use drugs. If you are sexually active, practice safe sex. Use a condom or other form of protection in order to prevent STIs. Take aspirin only as told by your health care provider. Make sure that you understand how much to take and what form to take. Work with your health care provider to find out whether it is safe and beneficial for you to take aspirin daily. Ask your health care provider if you need to take a cholesterol-lowering medicine (statin). Find healthy ways to manage stress, such as: Meditation, yoga, or listening to music. Journaling. Talking to a trusted person. Spending time with friends and family. Minimize exposure to UV radiation to reduce your risk of skin cancer. Safety Always wear your seat belt while driving or riding in a vehicle. Do not drive: If you have been drinking  alcohol. Do not ride with someone who has been drinking. When you are tired or distracted. While texting. If you have been using any mind-altering substances or drugs. Wear a helmet and other protective equipment during sports activities. If you have firearms in your house, make sure you follow all gun safety procedures. What's next? Visit your health care provider once a year for an annual wellness visit. Ask your health care provider how often you should have your eyes and teeth checked. Stay up to date on all vaccines. This information is not intended to replace advice given to you by your health care provider. Make sure you discuss any questions you have with your health care provider. Document Revised: 12/21/2020 Document Reviewed: 12/21/2020 Elsevier Patient Education  2024 [employer].

## 2023-12-13 NOTE — Assessment & Plan Note (Signed)
 On Synthroid  75 mcg daily.  Tolerating well.  Check TSH today.

## 2023-12-19 ENCOUNTER — Encounter: Payer: Self-pay | Admitting: Family Medicine

## 2023-12-19 ENCOUNTER — Ambulatory Visit: Payer: Self-pay | Admitting: Family Medicine

## 2023-12-19 LAB — TSH: TSH: 2.03 u[IU]/mL (ref 0.35–5.50)

## 2023-12-19 NOTE — Progress Notes (Signed)
 Labs are all stable. Her A1c is mildly elevated but not at the point where we have to start medications. Everything else is at goal. Do not need to make any changes to her treatment plan at this time. She should continue to work on diet and exercise and we can recheck everything in a year.   Stephanie Carter. Daneil Dunker, MD 12/19/2023 10:01 PM

## 2023-12-19 NOTE — Telephone Encounter (Signed)
 Left message to return call to our office at their convenience.

## 2023-12-20 NOTE — Telephone Encounter (Signed)
 Copied from CRM [id number]. Topic: Clinical - Lab/Test Results >> Dec 19, 2023  4:30 PM Martin[place] E wrote: Reason for CRM: Patient called wanting to speak with Bernell Brigham in regards to lab results. Callback number for patient is [phone].  See results note  [employer],RMA

## 2024-07-21 ENCOUNTER — Ambulatory Visit: Payer: Medicare Other

## 2024-08-05 ENCOUNTER — Other Ambulatory Visit: Payer: Self-pay | Admitting: *Deleted

## 2024-08-05 DIAGNOSIS — E039 Hypothyroidism, unspecified: Secondary | ICD-10-CM

## 2024-08-05 MED ORDER — LEVOTHYROXINE SODIUM 75 MCG PO TABS: 75.0000 ug | ORAL_TABLET | Freq: Every day | ORAL | 3 refills | Status: AC

## 2024-08-12 ENCOUNTER — Ambulatory Visit

## 2024-12-14 ENCOUNTER — Encounter: Admitting: Family Medicine
